# Patient Record
Sex: Female | Born: 1960 | Race: Black or African American | Hispanic: No | Marital: Married | Smoking: Never smoker
Health system: Southern US, Community
[De-identification: ages and names within clinical notes are randomized; demographics above are authoritative.]

## PROBLEM LIST (undated history)

## (undated) DIAGNOSIS — E7800 Pure hypercholesterolemia, unspecified: Secondary | ICD-10-CM

## (undated) DIAGNOSIS — D259 Leiomyoma of uterus, unspecified: Secondary | ICD-10-CM

## (undated) DIAGNOSIS — I10 Essential (primary) hypertension: Secondary | ICD-10-CM

## (undated) DIAGNOSIS — G4733 Obstructive sleep apnea (adult) (pediatric): Secondary | ICD-10-CM

---

## 2011-05-03 ENCOUNTER — Encounter (HOSPITAL_BASED_OUTPATIENT_CLINIC_OR_DEPARTMENT_OTHER): Payer: Self-pay | Admitting: Emergency Medicine

## 2011-05-03 ENCOUNTER — Emergency Department (HOSPITAL_BASED_OUTPATIENT_CLINIC_OR_DEPARTMENT_OTHER)
Admission: EM | Admit: 2011-05-03 | Discharge: 2011-05-03 | Disposition: A | Discharge status: Home / Self Care | Attending: Emergency Medicine | Admitting: Emergency Medicine

## 2011-05-03 DIAGNOSIS — R51 Headache: Secondary | ICD-10-CM | POA: Insufficient documentation

## 2011-05-03 DIAGNOSIS — I10 Essential (primary) hypertension: Secondary | ICD-10-CM | POA: Insufficient documentation

## 2011-05-03 DIAGNOSIS — E7800 Pure hypercholesterolemia, unspecified: Secondary | ICD-10-CM | POA: Insufficient documentation

## 2011-05-03 DIAGNOSIS — Z79899 Other long term (current) drug therapy: Secondary | ICD-10-CM | POA: Insufficient documentation

## 2011-05-03 HISTORY — DX: Essential (primary) hypertension: I10

## 2011-05-03 HISTORY — DX: Pure hypercholesterolemia, unspecified: E78.00

## 2011-05-03 HISTORY — DX: Leiomyoma of uterus, unspecified: D25.9

## 2011-05-03 MED ORDER — HYDROCODONE-ACETAMINOPHEN 5-500 MG PO TABS: 1.0000 | ORAL_TABLET | Freq: Four times a day (QID) | ORAL | Status: AC | PRN

## 2011-05-03 MED ORDER — SUMATRIPTAN SUCCINATE 6 MG/0.5ML ~~LOC~~ SOLN
6.0000 mg | Freq: Once | SUBCUTANEOUS | Status: AC
  Administered 2011-05-03: 6 mg via SUBCUTANEOUS
  Filled 2011-05-03: qty 0.5

## 2011-05-03 NOTE — ED Notes (Addendum)
Headache since last night.  Pain on left side of head which moves around left side of head.  Pt states she has had migraines in the past but they present differently each time.  Pt usually takes Bayer Migraine and relieves that pain but this time it did not.  Some nausea.  Some sensitivity to light and sound.  No blurry vision unless at computer.  No known fever.  Some cough yesterday.  Pt states she can take a hot shower and decreases the pain.  Pt relates she noticed some trembling in lip and two weeks ago some right arm pain.

## 2011-05-03 NOTE — ED Provider Notes (Signed)
History     CSN: [id number]  Arrival date & time 05/03/11  7829   First MD Initiated Contact with Patient 05/03/11 1055      Chief Complaint  Patient presents with  . Headache    (Consider location/radiation/quality/duration/timing/severity/associated sxs/prior treatment) Patient is a 51 y.o. female presenting with headaches. The history is provided by the patient.  Headache  The current episode started 2 days ago. The problem occurs constantly. The problem has been gradually worsening. The headache is associated with bright light. The pain is located in the frontal region. The quality of the pain is described as throbbing. The pain is severe. The pain does not radiate. Pertinent negatives include no anorexia and no fever.    Past Medical History  Diagnosis Date  . Migraine   . Uterine fibroid   . Hypertension   . Hypercholesteremia   . Vitamin D deficiency     History reviewed. No pertinent past surgical history.  History reviewed. No pertinent family history.  History  Substance Use Topics  . Smoking status: Not on file  . Smokeless tobacco: Not on file  . Alcohol Use:     OB History    Grav Para Term Preterm Abortions TAB SAB Ect Mult Living                  Review of Systems  Constitutional: Negative for fever.  Gastrointestinal: Negative for anorexia.  Neurological: Positive for headaches.  All other systems reviewed and are negative.    Allergies  Review of patient's allergies indicates no known allergies.  Home Medications   Current Outpatient Rx  Name Route Sig Dispense Refill  . ASPIRIN-ACETAMINOPHEN-CAFFEINE 250-250-65 MG PO TABS Oral Take 1 tablet by mouth every 6 (six) hours as needed.    Marland Kitchen EZETIMIBE-SIMVASTATIN 10-20 MG PO TABS Oral Take 1 tablet by mouth at bedtime.    Marland Kitchen MEDROXYPROGESTERONE ACETATE 150 MG/ML IM SUSP Intramuscular Inject 150 mg into the muscle every 3 (three) months.    . MULTIVITAMINS PO CAPS Oral Take 1 capsule by mouth  daily.    Marland Kitchen VALSARTAN 80 MG PO TABS Oral Take 80 mg by mouth daily.    Marland Kitchen VITAMIN D (ERGOCALCIFEROL) 50000 UNITS PO CAPS Oral Take 50,000 Units by mouth.      BP 131/90  Pulse 82  Temp(Src) 98.3 F (36.8 C) (Oral)  Resp 14  Ht 5\' 7"  (1.702 m)  Wt 155 lb (70.308 kg)  BMI 24.28 kg/m2  SpO2 100%  LMP 03/17/2011  Physical Exam  Nursing note and vitals reviewed. Constitutional: She is oriented to person, place, and time. She appears well-developed and well-nourished. No distress.  HENT:  Head: Normocephalic and atraumatic.  Eyes:       Fundoscopic exam wnl.  There is no papilledema.    Neck: Normal range of motion. Neck supple.  Cardiovascular: Normal rate and regular rhythm.  Exam reveals no gallop and no friction rub.   No murmur heard. Pulmonary/Chest: Effort normal and breath sounds normal. No respiratory distress. She has no wheezes.  Abdominal: Soft. Bowel sounds are normal. She exhibits no distension. There is no tenderness.  Musculoskeletal: Normal range of motion.  Neurological: She is alert and oriented to person, place, and time.  Skin: Skin is warm and dry. She is not diaphoretic.    ED Course  Procedures (including critical care time)  Labs Reviewed - No data to display No results found.   No diagnosis found.    MDM  Improved with imitrex.  Will discharge with pain meds, to return prn.        Geoffery Lyons, MD 05/03/11 1356

## 2011-05-03 NOTE — ED Notes (Signed)
Pt discharged home with one prescription. 

## 2015-04-11 ENCOUNTER — Encounter (HOSPITAL_BASED_OUTPATIENT_CLINIC_OR_DEPARTMENT_OTHER): Payer: Self-pay | Admitting: *Deleted

## 2015-04-11 ENCOUNTER — Emergency Department (HOSPITAL_BASED_OUTPATIENT_CLINIC_OR_DEPARTMENT_OTHER)

## 2015-04-11 ENCOUNTER — Emergency Department (HOSPITAL_BASED_OUTPATIENT_CLINIC_OR_DEPARTMENT_OTHER)
Admission: EM | Admit: 2015-04-11 | Discharge: 2015-04-11 | Disposition: A | Discharge status: Home / Self Care | Attending: Emergency Medicine | Admitting: Emergency Medicine

## 2015-04-11 DIAGNOSIS — I10 Essential (primary) hypertension: Secondary | ICD-10-CM | POA: Insufficient documentation

## 2015-04-11 DIAGNOSIS — R202 Paresthesia of skin: Secondary | ICD-10-CM | POA: Diagnosis not present

## 2015-04-11 DIAGNOSIS — E559 Vitamin D deficiency, unspecified: Secondary | ICD-10-CM | POA: Insufficient documentation

## 2015-04-11 DIAGNOSIS — R42 Dizziness and giddiness: Secondary | ICD-10-CM | POA: Diagnosis not present

## 2015-04-11 DIAGNOSIS — F419 Anxiety disorder, unspecified: Secondary | ICD-10-CM | POA: Diagnosis not present

## 2015-04-11 DIAGNOSIS — G43909 Migraine, unspecified, not intractable, without status migrainosus: Secondary | ICD-10-CM | POA: Diagnosis not present

## 2015-04-11 DIAGNOSIS — E7800 Pure hypercholesterolemia, unspecified: Secondary | ICD-10-CM | POA: Insufficient documentation

## 2015-04-11 DIAGNOSIS — Z86018 Personal history of other benign neoplasm: Secondary | ICD-10-CM | POA: Diagnosis not present

## 2015-04-11 LAB — BASIC METABOLIC PANEL
Anion gap: 6 (ref 5–15)
BUN: 10 mg/dL (ref 6–20)
CALCIUM: 8.6 mg/dL — AB (ref 8.9–10.3)
CO2: 23 mmol/L (ref 22–32)
CREATININE: 0.83 mg/dL (ref 0.44–1.00)
Chloride: 112 mmol/L — ABNORMAL HIGH (ref 101–111)
GFR calc non Af Amer: >60 mL/min (ref >60)
Glucose, Bld: 130 mg/dL — ABNORMAL HIGH (ref 65–99)
Potassium: 3.5 mmol/L (ref 3.5–5.1)
Sodium: 141 mmol/L (ref 135–145)

## 2015-04-11 LAB — CBC WITH DIFFERENTIAL/PLATELET
BASOS PCT: 1 %
Basophils Absolute: 0 10*3/uL (ref 0.0–0.1)
EOS ABS: 0.1 10*3/uL (ref 0.0–0.7)
Eosinophils Relative: 2 %
HCT: 36.4 % (ref 36.0–46.0)
HEMOGLOBIN: 12.5 g/dL (ref 12.0–15.0)
Lymphocytes Relative: 32 %
Lymphs Abs: 1.2 10*3/uL (ref 0.7–4.0)
MCH: 27.5 pg (ref 26.0–34.0)
MCHC: 34.3 g/dL (ref 30.0–36.0)
MCV: 80.2 fL (ref 78.0–100.0)
MONO ABS: 0.4 10*3/uL (ref 0.1–1.0)
MONOS PCT: 10 %
NEUTROS PCT: 55 %
Neutro Abs: 2 10*3/uL (ref 1.7–7.7)
Platelets: 309 10*3/uL (ref 150–400)
RBC: 4.54 MIL/uL (ref 3.87–5.11)
RDW: 13.5 % (ref 11.5–15.5)
WBC: 3.6 10*3/uL — ABNORMAL LOW (ref 4.0–10.5)

## 2015-04-11 NOTE — ED Notes (Signed)
Dizziness. She was awaken with an electrical sensation going through her brain followed by anxiety. Her muscles have been contracting in her back and neck. Tingling in both hands and feel throughout the day on and off.

## 2015-04-11 NOTE — ED Provider Notes (Signed)
CSN: [id number]     Arrival date & time 04/11/15  1829 History  By signing my name below, I, Christina Calderon, attest that this documentation has been prepared under the direction and in the presence of Quintella Reichert, MD. Electronically Signed: Meriel Calderon, ED Scribe. 04/11/2015. 7:14 PM.  Chief Complaint  Patient presents with  . Dizziness  . Tingling   The history is provided by the patient. No language interpreter was used.   HPI Comments: Christina Calderon is a 54 y.o. female, with a h/o migraines, HTN, and HLD, who presents to the Emergency Department complaining of a sudden onset, severe tingling sensation and headache onset early this morning, that woke her up from sleep, and that has since resolved. Pt states she felt 'an electric sensation' ranging from her right, lateral neck that radiated 'through my brain' and that woke her up from sleep early this morning. She then notes onset of a tingling sensation radiating throughout her entire body, that started in her feet and radiated up her legs and to her upper body. Additionally, she describes the feeling that 'her mind left her body'. Pt reports she performed breathing exercises to calm herself down following the incident with resolution of her symptoms. She has a h/o of a similar episode when she received news that her father had an aneurysm 3 months ago. Pt states she was told she was 'stressed out' by a doctor in [place]. She was then evaluated by a doctor upon return to the [place] who told her she had experienced a dissociation event and he recommended breathing exercises and yoga. Now, she denies a headache but reports mild tingling in her feet, 'a heaviness in my tongue', and tachycardia. Pt also has a h/o palpitations. Pt is tearful during history and reports being significantly stressed over her abroad family's health conditions (father with h/o aneurysm and mother with Alzheimers).   Past Medical History  Diagnosis Date  . Migraine    . Uterine fibroid   . Hypertension   . Hypercholesteremia   . Vitamin D deficiency    History reviewed. No pertinent past surgical history. No family history on file. Social History  Substance Use Topics  . Smoking status: Never Smoker   . Smokeless tobacco: None  . Alcohol Use: No   OB History    No data available     Review of Systems  Constitutional: Negative for fever.  Neurological: Positive for headaches.  Psychiatric/Behavioral: The patient is nervous/anxious.   All other systems reviewed and are negative.  Allergies  Review of patient's allergies indicates no known allergies.  Home Medications   Prior to Admission medications   Medication Sig Start Date End Date Taking? Authorizing Provider  aspirin-acetaminophen-caffeine (EXCEDRIN MIGRAINE) [phone] MG per tablet Take 1 tablet by mouth every 6 (six) hours as needed.    Historical Provider, MD  ezetimibe-simvastatin (VYTORIN) 10-20 MG per tablet Take 1 tablet by mouth at bedtime.    Historical Provider, MD  medroxyPROGESTERone (DEPO-PROVERA) 150 MG/ML injection Inject 150 mg into the muscle every 3 (three) months.    Historical Provider, MD  Multiple Vitamin (MULTIVITAMIN) capsule Take 1 capsule by mouth daily.    Historical Provider, MD  valsartan (DIOVAN) 80 MG tablet Take 80 mg by mouth daily.    Historical Provider, MD  Vitamin D, Ergocalciferol, (DRISDOL) 50000 UNITS CAPS Take 50,000 Units by mouth.    Historical Provider, MD   BP 151/97 mmHg  Pulse 97  Temp(Src) 99 F (37.2 C) (  Oral)  Resp 18  Ht 5\' 7"  (1.702 m)  Wt 173 lb (78.472 kg)  BMI 27.09 kg/m2  SpO2 100% Physical Exam  Constitutional: She is oriented to person, place, and time. She appears well-developed and well-nourished.  Anxious, tearful.   HENT:  Head: Normocephalic and atraumatic.  Eyes: EOM are normal. Pupils are equal, round, and reactive to light.  Cardiovascular: Normal rate and regular rhythm.   No murmur  heard. Pulmonary/Chest: Effort normal and breath sounds normal. No respiratory distress.  Abdominal: Soft. There is no tenderness. There is no rebound and no guarding.  Musculoskeletal: She exhibits no edema or tenderness.  Neurological: She is alert and oriented to person, place, and time. No cranial nerve deficit. Coordination normal.  Cranial nerves 2-12 intact.   Skin: Skin is warm and dry.  Psychiatric: She has a normal mood and affect. Her behavior is normal.  Nursing note and vitals reviewed.   ED Course  Procedures  DIAGNOSTIC STUDIES: Oxygen Saturation is 100% on RA, normal by my interpretation.    COORDINATION OF CARE: 7:12 PM Discussed treatment plan which includes to order a head CT and diagnostic labs with pt. Discussed unremarkable EKG reading with pt. Pt acknowledges and agrees to plan.   Labs Review Labs Reviewed  BASIC METABOLIC PANEL - Abnormal; Notable for the following:    Chloride 112 (*)    Glucose, Bld 130 (*)    Calcium 8.6 (*)    All other components within normal limits  CBC WITH DIFFERENTIAL/PLATELET - Abnormal; Notable for the following:    WBC 3.6 (*)    All other components within normal limits    Imaging Review Ct Head Wo Contrast  04/11/2015  CLINICAL DATA:  54 year old female with dizziness EXAM: CT HEAD WITHOUT CONTRAST TECHNIQUE: Contiguous axial images were obtained from the base of the skull through the vertex without intravenous contrast. COMPARISON:  None. FINDINGS: The ventricles and sulci are appropriate in size for patient's age. Mild periventricular and deep white matter hypodensities represent chronic microvascular ischemic changes. There is no intracranial hemorrhage. No mass effect or midline shift identified. The visualized paranasal sinuses and mastoid air cells are well aerated. The calvarium is intact. IMPRESSION: No acute intracranial hemorrhage. Mild chronic microvascular ischemic disease. If symptoms persist and there are no  contraindications, MRI may provide better evaluation if clinically indicated Electronically Signed   By: Anner Crete M.D.   On: 04/11/2015 20:43   I have personally reviewed and evaluated these images and lab results as part of my medical decision-making.   EKG Interpretation   Date/Time:  Thursday April 11 2015 18:42:10 EST Ventricular Rate:  85 PR Interval:  142 QRS Duration: 78 QT Interval:  368 QTC Calculation: 437 R Axis:   57 Text Interpretation:  Normal sinus rhythm Normal ECG Confirmed by Hazle Coca [phone]) on 04/11/2015 6:57:08 PM      MDM   Final diagnoses:  Paresthesia   Patient here for evaluation of generalized body paresthesias and electric sensation through her head. Her symptoms are essentially resolved in the emergency department. She has a nonfocal neurologic examination. She's had multiple similar episodes in the past. Presentation is not consistent with CVA, subarachnoid hemorrhage, meningitis, Guillain-Barr. Discussed with patient findings of studies as well as CT head with chronic microvascular disease. Recommend outpatient PCP as well as neurology follow-up for further evaluation and possible MRI of her brain.  I personally performed the services described in this documentation, which was scribed in  my presence. The recorded information has been reviewed and is accurate.    Quintella Reichert, MD 04/12/15 [phone]

## 2015-04-11 NOTE — Discharge Instructions (Signed)
Paresthesia Paresthesia is an abnormal burning or prickling sensation. This sensation is generally felt in the hands, arms, legs, or feet. However, it may occur in any part of the body. Usually, it is not painful. The feeling may be described as:  Tingling or numbness.  Pins and needles.  Skin crawling.  Buzzing.  Limbs falling asleep.  Itching. Most people experience temporary (transient) paresthesia at some time in their lives. Paresthesia may occur when you breathe too quickly (hyperventilation). It can also occur without any apparent cause. Commonly, paresthesia occurs when pressure is placed on a nerve. The sensation quickly goes away after the pressure is removed. For some people, however, paresthesia is a long-lasting (chronic) condition that is caused by an underlying disorder. If you continue to have paresthesia, you may need further medical evaluation. HOME CARE INSTRUCTIONS Watch your condition for any changes. Taking the following actions may help to lessen any discomfort that you are feeling:  Avoid drinking alcohol.  Try acupuncture or massage to help relieve your symptoms.  Keep all follow-up visits as directed by your health care provider. This is important. SEEK MEDICAL CARE IF:  You continue to have episodes of paresthesia.  Your burning or prickling feeling gets worse when you walk.  You have pain, cramps, or dizziness.  You develop a rash. SEEK IMMEDIATE MEDICAL CARE IF:  You feel weak.  You have trouble walking or moving.  You have problems with speech, understanding, or vision.  You feel confused.  You cannot control your bladder or bowel movements.  You have numbness after an injury.  You faint.   This information is not intended to replace advice given to you by your health care provider. Make sure you discuss any questions you have with your health care provider.   Document Released: 03/20/2002 Document Revised: 08/14/2014 Document Reviewed:  03/26/2014 Elsevier Interactive Patient Education 2016 [employer] Inc[employer]  

## 2016-06-28 ENCOUNTER — Encounter (HOSPITAL_BASED_OUTPATIENT_CLINIC_OR_DEPARTMENT_OTHER): Payer: Self-pay | Admitting: *Deleted

## 2016-06-28 ENCOUNTER — Emergency Department (HOSPITAL_BASED_OUTPATIENT_CLINIC_OR_DEPARTMENT_OTHER)
Admission: EM | Admit: 2016-06-28 | Discharge: 2016-06-29 | Disposition: A | Discharge status: Home / Self Care | Attending: Emergency Medicine | Admitting: Emergency Medicine

## 2016-06-28 DIAGNOSIS — M791 Myalgia: Secondary | ICD-10-CM | POA: Diagnosis not present

## 2016-06-28 DIAGNOSIS — R110 Nausea: Secondary | ICD-10-CM | POA: Diagnosis not present

## 2016-06-28 DIAGNOSIS — I10 Essential (primary) hypertension: Secondary | ICD-10-CM | POA: Insufficient documentation

## 2016-06-28 DIAGNOSIS — Z79899 Other long term (current) drug therapy: Secondary | ICD-10-CM | POA: Diagnosis not present

## 2016-06-28 DIAGNOSIS — R1013 Epigastric pain: Secondary | ICD-10-CM

## 2016-06-28 LAB — CBC WITH DIFFERENTIAL/PLATELET
BASOS ABS: 0 10*3/uL (ref 0.0–0.1)
Basophils Relative: 1 %
EOS PCT: 4 %
Eosinophils Absolute: 0.2 10*3/uL (ref 0.0–0.7)
HEMATOCRIT: 35.1 % — AB (ref 36.0–46.0)
Hemoglobin: 12.1 g/dL (ref 12.0–15.0)
LYMPHS ABS: 1.6 10*3/uL (ref 0.7–4.0)
LYMPHS PCT: 44 %
MCH: 27.8 pg (ref 26.0–34.0)
MCHC: 34.5 g/dL (ref 30.0–36.0)
MCV: 80.5 fL (ref 78.0–100.0)
MONO ABS: 0.5 10*3/uL (ref 0.1–1.0)
MONOS PCT: 13 %
NEUTROS ABS: 1.3 10*3/uL — AB (ref 1.7–7.7)
Neutrophils Relative %: 38 %
Platelets: 262 10*3/uL (ref 150–400)
RBC: 4.36 MIL/uL (ref 3.87–5.11)
RDW: 13.3 % (ref 11.5–15.5)
WBC: 3.5 10*3/uL — ABNORMAL LOW (ref 4.0–10.5)

## 2016-06-28 NOTE — ED Provider Notes (Signed)
[facility] Provider Note   CSN: [id number] Arrival date & time: 06/28/16  2326   By signing my name below, I, Delton Prairie, attest that this documentation has been prepared under the direction and in the presence of Merryl Hacker, MD  Electronically Signed: Delton Prairie, ED Scribe. 06/28/16. 12:28 AM.   History   Chief Complaint Chief Complaint  Patient presents with  . Chest Pain    The history is provided by the patient. No language interpreter was used.   HPI Comments:  Christina Calderon is a 56 y.o. female, with a PMHx of HTN, who presents to the Emergency Department complaining of acute onset, gradually worsening, burning, sharp, "6-7/10" chest pain/Abdominal pain onset 45 minutes PTA. Pt states her pain radiates to her back. She also reports belching and nausea. She states she usually does not drink but did have a cocktail around 3 PM today. She also notes she ate shrimp with an adequate amount of hot sauce for dinner. No alleviating or modifying factors noted. Pt denies a cough, congestion, a hx of heart disease or any other associated symptoms. Pt states she takes medication for a heart flutter. No other complaints noted.   Past Medical History:  Diagnosis Date  . Hypercholesteremia   . Hypertension   . Migraine   . Uterine fibroid   . Vitamin D deficiency     There are no active problems to display for this patient.   History reviewed. No pertinent surgical history.  OB History    No data available       Home Medications    Prior to Admission medications   Medication Sig Start Date End Date Taking? Authorizing Provider  ezetimibe-simvastatin (VYTORIN) 10-40 MG tablet Take 1 tablet by mouth daily.   Yes Historical Provider, MD  propranolol ER (INDERAL LA) 80 MG 24 hr capsule Take 80 mg by mouth daily.   Yes Historical Provider, MD  valsartan-hydrochlorothiazide (DIOVAN HCT) 80-12.5 MG tablet Take 1 tablet by mouth daily.   Yes Historical  Provider, MD  aspirin-acetaminophen-caffeine (EXCEDRIN MIGRAINE) [phone] MG per tablet Take 1 tablet by mouth every 6 (six) hours as needed.    Historical Provider, MD  ezetimibe-simvastatin (VYTORIN) 10-20 MG per tablet Take 1 tablet by mouth at bedtime.    Historical Provider, MD  medroxyPROGESTERone (DEPO-PROVERA) 150 MG/ML injection Inject 150 mg into the muscle every 3 (three) months.    Historical Provider, MD  Multiple Vitamin (MULTIVITAMIN) capsule Take 1 capsule by mouth daily.    Historical Provider, MD  omeprazole (PRILOSEC) 20 MG capsule Take 1 capsule (20 mg total) by mouth daily. 06/29/16   Merryl Hacker, MD  valsartan (DIOVAN) 80 MG tablet Take 80 mg by mouth daily.    Historical Provider, MD  Vitamin D, Ergocalciferol, (DRISDOL) 50000 UNITS CAPS Take 50,000 Units by mouth.    Historical Provider, MD    Family History History reviewed. No pertinent family history.  Social History Social History  Substance Use Topics  . Smoking status: Never Smoker  . Smokeless tobacco: Never Used  . Alcohol use No     Allergies   Patient has no known allergies.   Review of Systems Review of Systems  HENT: Negative for congestion.   Respiratory: Negative for cough.   Cardiovascular: Positive for chest pain.  Gastrointestinal: Positive for abdominal pain and nausea. Negative for vomiting.  Musculoskeletal: Positive for myalgias.  All other systems reviewed and are negative.  Physical Exam Updated Vital Signs  BP 116/89   Pulse 66   Temp 97.7 F (36.5 C) (Oral)   Resp 13   Ht 5\' 7"  (1.702 m)   Wt 160 lb (72.6 kg)   SpO2 98%   BMI 25.06 kg/m   Physical Exam  Constitutional: She is oriented to person, place, and time. She appears well-developed and well-nourished.  HENT:  Head: Normocephalic and atraumatic.  Cardiovascular: Normal rate, regular rhythm and normal heart sounds.   Pulmonary/Chest: Effort normal. No respiratory distress. She has no wheezes.  Abdominal:  Soft. Bowel sounds are normal. She exhibits no mass. There is tenderness. There is no rebound.  Minimal epigastric tenderness to palpation without rebound or guarding, no right upper quadrant tenderness  Neurological: She is alert and oriented to person, place, and time.  Skin: Skin is warm and dry.  Psychiatric: She has a normal mood and affect.  Nursing note and vitals reviewed.    ED Treatments / Results  DIAGNOSTIC STUDIES:  Oxygen Saturation is 100% on RA, normal by my interpretation.    COORDINATION OF CARE:  12:22 AM Discussed treatment plan with pt at bedside and pt agreed to plan.  Labs (all labs ordered are listed, but only abnormal results are displayed) Labs Reviewed  CBC WITH DIFFERENTIAL/PLATELET - Abnormal; Notable for the following:       Result Value   WBC 3.5 (*)    HCT 35.1 (*)    Neutro Abs 1.3 (*)    All other components within normal limits  COMPREHENSIVE METABOLIC PANEL - Abnormal; Notable for the following:    Glucose, Bld 112 (*)    All other components within normal limits  LIPASE, BLOOD  TROPONIN I    EKG  EKG Interpretation  Date/Time:  Sunday June 28 2016 23:31:46 EDT Ventricular Rate:  78 PR Interval:  170 QRS Duration: 74 QT Interval:  354 QTC Calculation: 403 R Axis:   21 Text Interpretation:  Normal sinus rhythm Normal ECG Confirmed by Jaimya Feliciano  MD, Siomara Burkel ([id number]) on 06/28/2016 11:45:39 PM       Radiology Dg Abdomen Acute W/chest  Result Date: 06/29/2016 CLINICAL DATA:  Upper abdominal pain EXAM: DG ABDOMEN ACUTE W/ 1V CHEST COMPARISON:  None. FINDINGS: Cardiomediastinal contours are normal. The lungs are clear. There is bilateral costophrenic angle blunting, likely scarring. No pneumothorax. There are multiple calcified fibroids in the pelvis. There is no free intraperitoneal air. No dilated small bowel or enteric fluid levels. Large amount of stool in the left upper quadrant. IMPRESSION: 1. No active cardiopulmonary disease. 2. No  free intraperitoneal air. 3. No evidence of small-bowel obstruction. 4. Multiple calcified uterine fibroids. Electronically Signed   By: Ulyses Jarred M.D.   On: 06/29/2016 00:59    Procedures Procedures (including critical care time)  Medications Ordered in ED Medications  gi cocktail (Maalox,Lidocaine,Donnatal) (30 mLs Oral Given 06/29/16 0054)  pantoprazole (PROTONIX) injection 40 mg (40 mg Intravenous Given 06/29/16 0054)  morphine 4 MG/ML injection 4 mg (4 mg Intravenous Given 06/29/16 0054)     Initial Impression / Assessment and Plan / ED Course  I have reviewed the triage vital signs and the nursing notes.  Pertinent labs & imaging results that were available during my care of the patient were reviewed by me and considered in my medical decision making (see chart for details).     Patient presents for chest pain and epigastric pain. She relates this to food. She is overall nontoxic appearing. Vital signs reassuring. EKG is nonischemic.  History is most suggestive of reflux versus gallbladder pathology. Troponin negative. Basic labwork including LFT and lipase reassuring. Acute abdominal series without any evidence of obstruction. Patient much improved after morphine, Protonix, and a GI cocktail. She is able tolerate fluids. Will start on a daily acid reducer. If symptoms persist with food intake, she may need right upper quadrant ultrasound and/or endoscopy. Discussed this with the patient. She is in agreement with the plan. GI follow-up provided.  After history, exam, and medical workup I feel the patient has been appropriately medically screened and is safe for discharge home. Pertinent diagnoses were discussed with the patient. Patient was given return precautions.   Final Clinical Impressions(s) / ED Diagnoses   Final diagnoses:  Epigastric pain    New Prescriptions New Prescriptions   OMEPRAZOLE (PRILOSEC) 20 MG CAPSULE    Take 1 capsule (20 mg total) by mouth daily.   I  personally performed the services described in this documentation, which was scribed in my presence. The recorded information has been reviewed and is accurate.     Merryl Hacker, MD 06/29/16 [phone]

## 2016-06-28 NOTE — ED Triage Notes (Signed)
Pt states she ate spicy food tonight and had gone to bed. Had sudden onset of upper abd pain radiating to back. Started belching a lot. Describes as burning, stabbing, "like nothing I've ever had before." Denies other s/s.

## 2016-06-29 ENCOUNTER — Emergency Department (HOSPITAL_BASED_OUTPATIENT_CLINIC_OR_DEPARTMENT_OTHER)

## 2016-06-29 LAB — COMPREHENSIVE METABOLIC PANEL
ALBUMIN: 4 g/dL (ref 3.5–5.0)
ALK PHOS: 75 U/L (ref 38–126)
ALT: 31 U/L (ref 14–54)
ANION GAP: 7 (ref 5–15)
AST: 39 U/L (ref 15–41)
BILIRUBIN TOTAL: 0.9 mg/dL (ref 0.3–1.2)
BUN: 10 mg/dL (ref 6–20)
CALCIUM: 9.2 mg/dL (ref 8.9–10.3)
CO2: 25 mmol/L (ref 22–32)
Chloride: 107 mmol/L (ref 101–111)
Creatinine, Ser: 0.62 mg/dL (ref 0.44–1.00)
GFR calc non Af Amer: >60 mL/min (ref >60)
Glucose, Bld: 112 mg/dL — ABNORMAL HIGH (ref 65–99)
Potassium: 3.5 mmol/L (ref 3.5–5.1)
Sodium: 139 mmol/L (ref 135–145)
TOTAL PROTEIN: 7 g/dL (ref 6.5–8.1)

## 2016-06-29 LAB — TROPONIN I

## 2016-06-29 LAB — LIPASE, BLOOD: LIPASE: 32 U/L (ref 11–51)

## 2016-06-29 MED ORDER — MORPHINE SULFATE (PF) 4 MG/ML IV SOLN
4.0000 mg | Freq: Once | INTRAVENOUS | Status: AC
  Administered 2016-06-29: 4 mg via INTRAVENOUS
  Filled 2016-06-29: qty 1

## 2016-06-29 MED ORDER — GI COCKTAIL ~~LOC~~
30.0000 mL | Freq: Once | ORAL | Status: AC
  Administered 2016-06-29: 30 mL via ORAL
  Filled 2016-06-29: qty 30

## 2016-06-29 MED ORDER — PANTOPRAZOLE SODIUM 40 MG IV SOLR
40.0000 mg | Freq: Once | INTRAVENOUS | Status: AC
  Administered 2016-06-29: 40 mg via INTRAVENOUS
  Filled 2016-06-29: qty 40

## 2016-06-29 MED ORDER — OMEPRAZOLE 20 MG PO CPDR: 20.0000 mg | DELAYED_RELEASE_CAPSULE | Freq: Every day | ORAL | 0 refills | Status: AC

## 2016-06-29 NOTE — ED Notes (Signed)
EDP at [facility], pt given ice water for PO challenge.

## 2016-06-29 NOTE — Discharge Instructions (Signed)
You were seen today for upper abdominal pain. Your workup is reassuring. This is likely reflux. It could also be gallbladder disease. You'll be started on an acid reducer. If he continued to experience symptoms with eating, you may need to have a gallbladder ultrasound.  Follow-up with your primary physician. If symptoms worsen, GI evaluation may also be helpful. Referral given above. If you have any new or worsening symptoms including vomiting, worsening pain, you should be reevaluated immediately.

## 2016-06-29 NOTE — ED Notes (Signed)
Alert, NAD, calm, interactive, resps e/u, speaking in clear complete sentences, no dyspnea noted, skin W&D, VSS, c/o epigastric and RUQ pain, onset after eating spicy shrimp, also mentions mixed drink/ ETOH, (denies: sob, NVD, fever, palpitations, numbness/ tingling, dizziness or visual changes). Family at [facility].

## 2017-05-09 IMAGING — CT CT HEAD W/O CM
1 series · 16 of 30 positions shown, 20 images · non-contrast
Comparison: None.

CLINICAL DATA: 54-year-old female with dizziness

EXAM:
CT HEAD WITHOUT CONTRAST
TECHNIQUE: Contiguous axial images were obtained from the base of the skull
through the vertex without intravenous contrast.

[Series 2: head wo · axial · 0.45mm/px · z∈[-102,+43]mm · 16 of 33 slices shown, 20 images]
[im 2/33  brain]
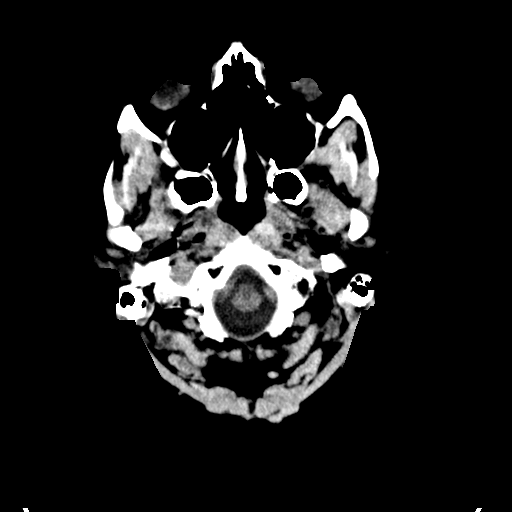
[im 2/33  bone]
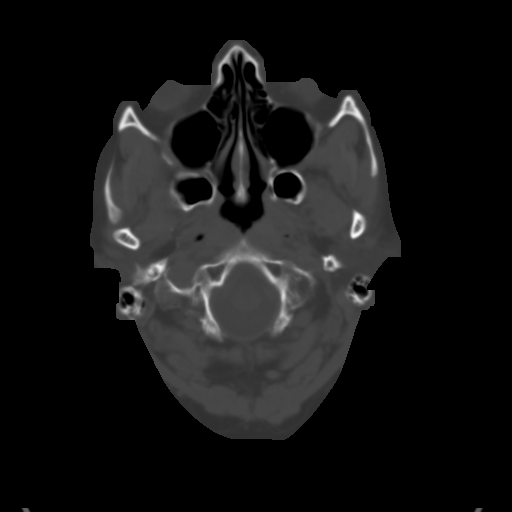
[im 4/33  brain]
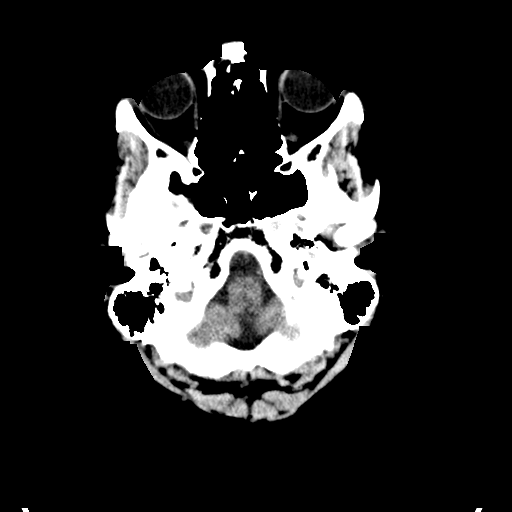
[im 6/33  brain]
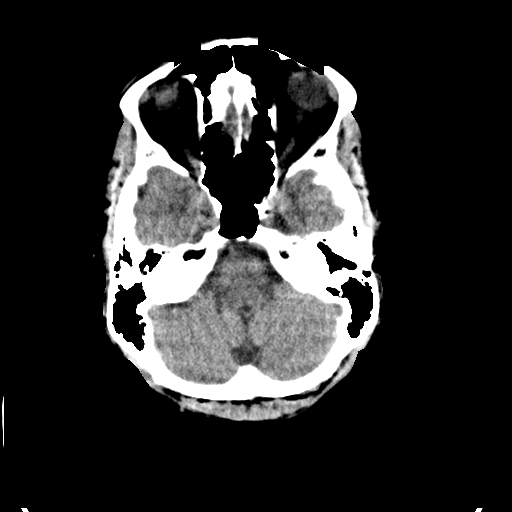
[im 8/33  brain]
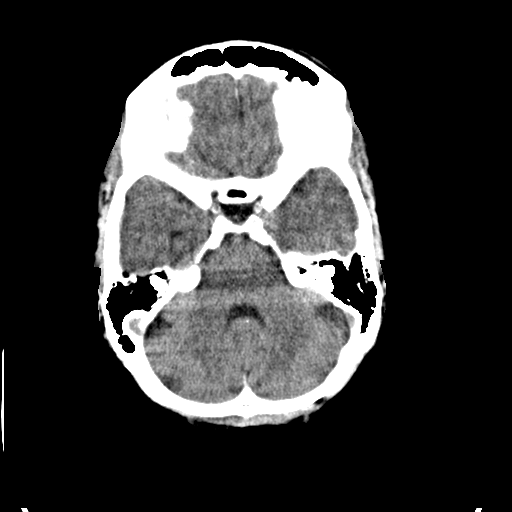
[im 9/33  brain]
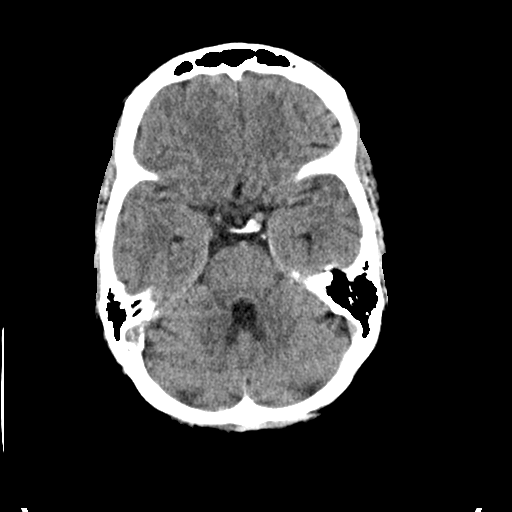
[im 9/33  bone]
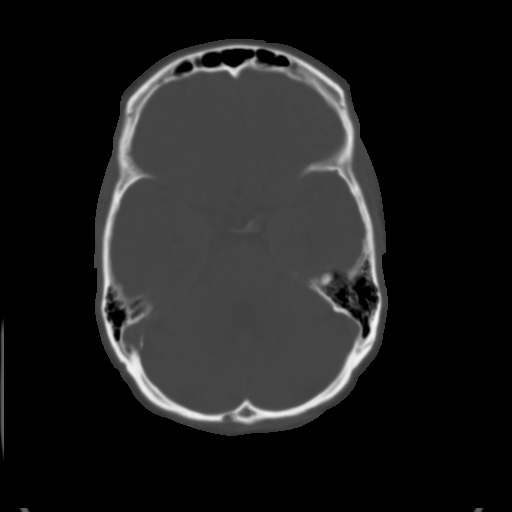
[im 12/33  brain]
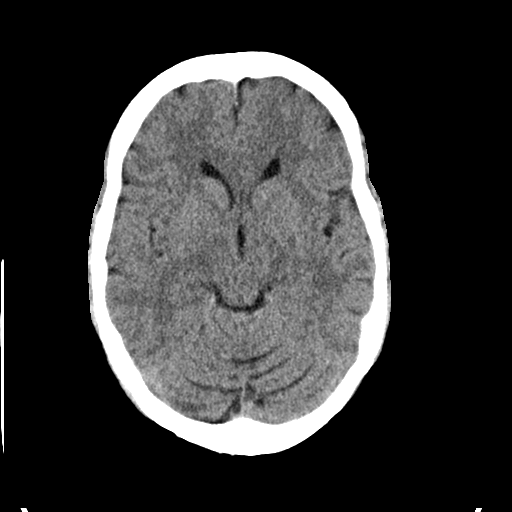
[im 14/33  brain]
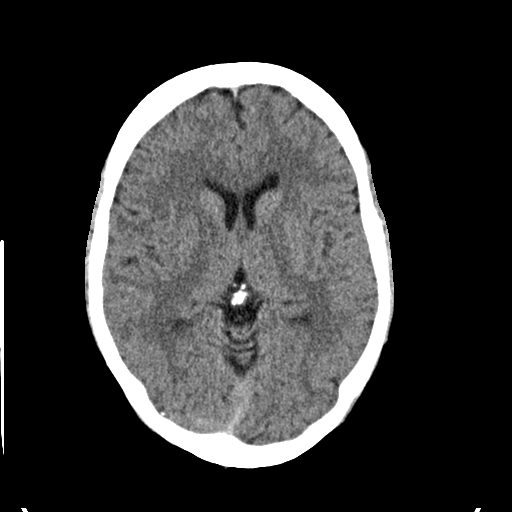
[im 16/33  brain]
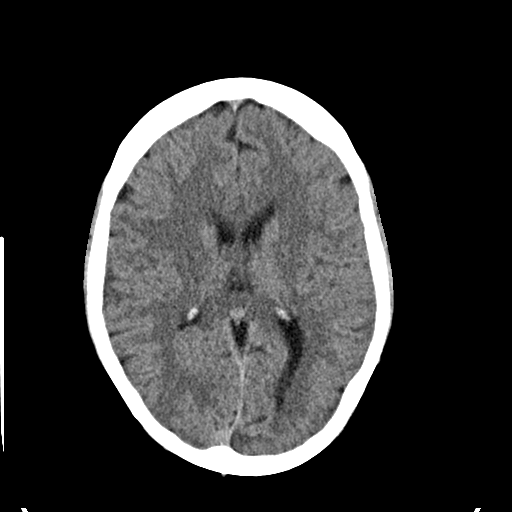
[im 17/33  brain]
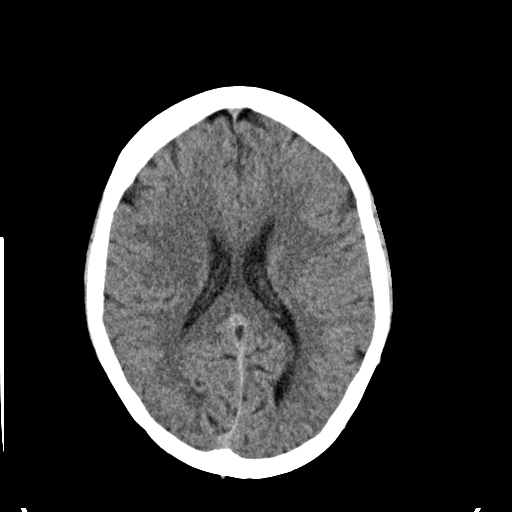
[im 17/33  bone]
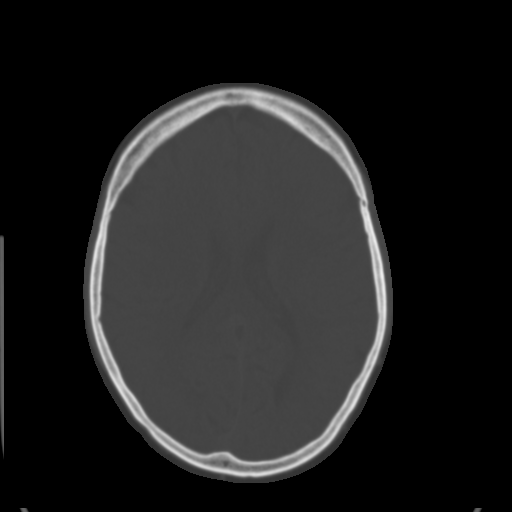
[im 19/33  brain]
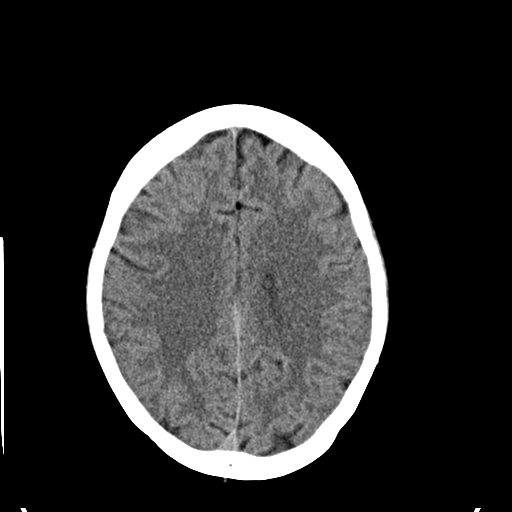
[im 21/33  brain]
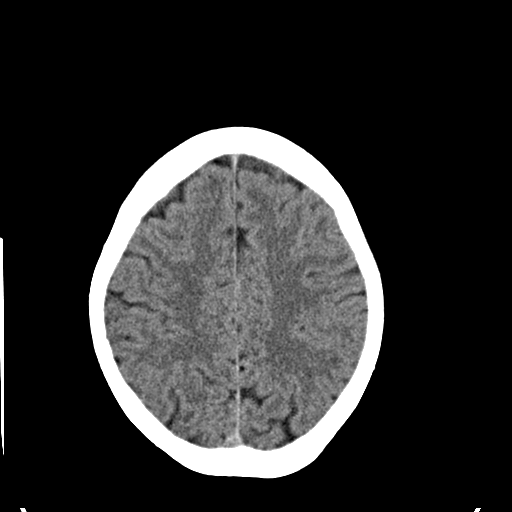
[im 24/33  brain]
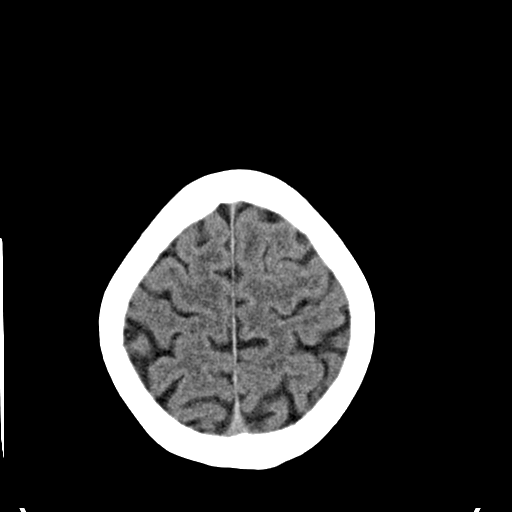
[im 25/33  brain]
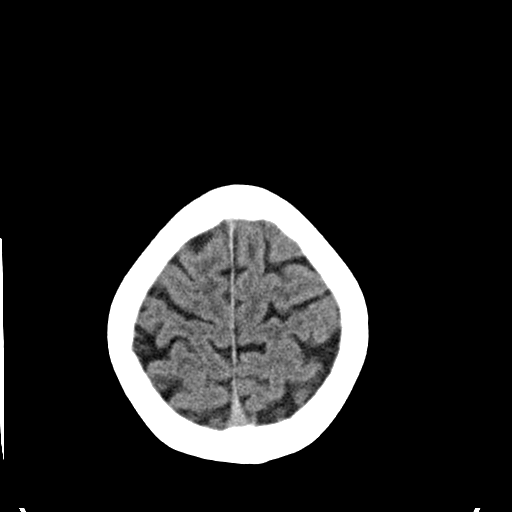
[im 25/33  bone]
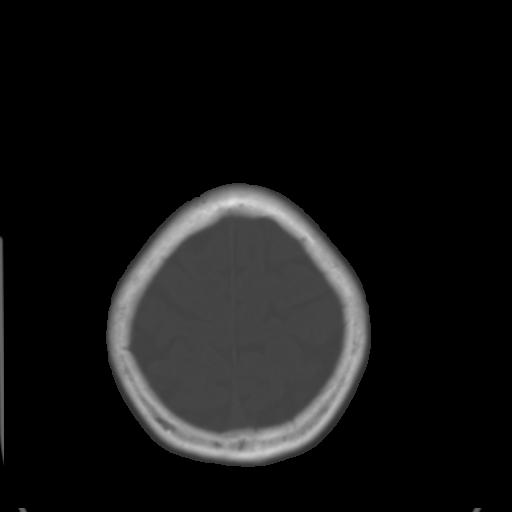
[im 27/33  brain]
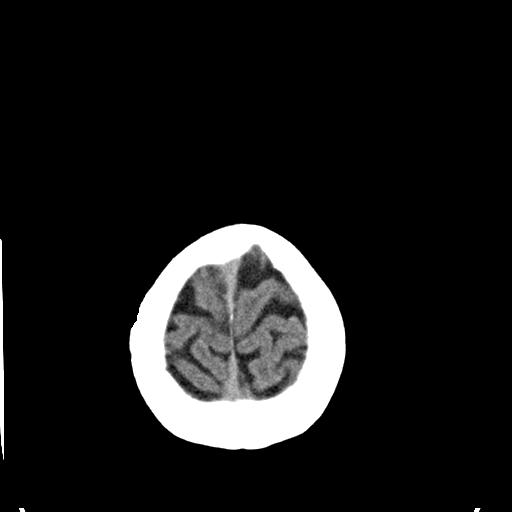
[im 29/33  brain]
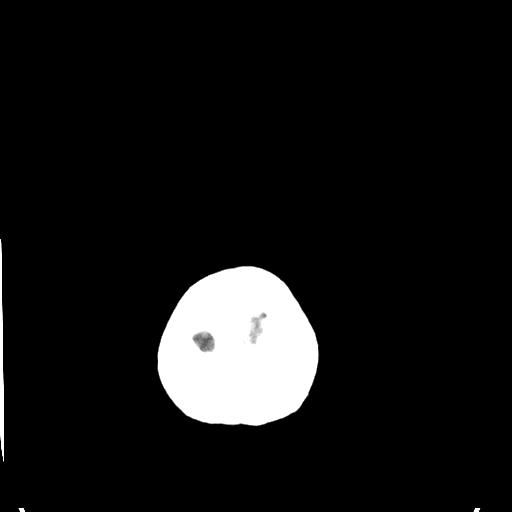
[im 31/33  brain]
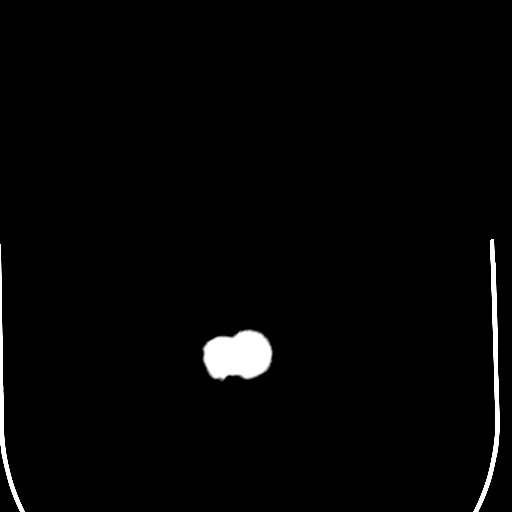

[16 of 30 positions shown; findings below may reference images not displayed]

FINDINGS: The ventricles and sulci are appropriate in size for patient's age.
Mild periventricular and deep white matter hypodensities represent
chronic microvascular ischemic changes. There is no intracranial
hemorrhage. No mass effect or midline shift identified.

The visualized paranasal sinuses and mastoid air cells are well
aerated. The calvarium is intact.
IMPRESSION: No acute intracranial hemorrhage.

Mild chronic microvascular ischemic disease.

If symptoms persist and there are no contraindications, MRI may
provide better evaluation if clinically indicated

## 2019-05-31 ENCOUNTER — Emergency Department (HOSPITAL_BASED_OUTPATIENT_CLINIC_OR_DEPARTMENT_OTHER)
Admission: EM | Admit: 2019-05-31 | Discharge: 2019-05-31 | Disposition: A | Discharge status: Home / Self Care | Attending: Emergency Medicine | Admitting: Emergency Medicine

## 2019-05-31 ENCOUNTER — Encounter (HOSPITAL_BASED_OUTPATIENT_CLINIC_OR_DEPARTMENT_OTHER): Payer: Self-pay

## 2019-05-31 ENCOUNTER — Other Ambulatory Visit: Payer: Self-pay

## 2019-05-31 DIAGNOSIS — E782 Mixed hyperlipidemia: Secondary | ICD-10-CM | POA: Diagnosis not present

## 2019-05-31 DIAGNOSIS — R300 Dysuria: Secondary | ICD-10-CM | POA: Diagnosis not present

## 2019-05-31 DIAGNOSIS — I10 Essential (primary) hypertension: Secondary | ICD-10-CM | POA: Insufficient documentation

## 2019-05-31 DIAGNOSIS — M545 Low back pain, unspecified: Secondary | ICD-10-CM

## 2019-05-31 DIAGNOSIS — Z79899 Other long term (current) drug therapy: Secondary | ICD-10-CM | POA: Insufficient documentation

## 2019-05-31 LAB — URINALYSIS, ROUTINE W REFLEX MICROSCOPIC
Bilirubin Urine: NEGATIVE
Glucose, UA: NEGATIVE mg/dL
Ketones, ur: NEGATIVE mg/dL
Nitrite: NEGATIVE
Protein, ur: NEGATIVE mg/dL
Specific Gravity, Urine: <1.005 — ABNORMAL LOW (ref 1.005–1.030)
pH: 6.5 (ref 5.0–8.0)

## 2019-05-31 LAB — URINALYSIS, MICROSCOPIC (REFLEX)

## 2019-05-31 MED ORDER — CYCLOBENZAPRINE HCL 10 MG PO TABS: 10.0000 mg | ORAL_TABLET | Freq: Two times a day (BID) | ORAL | 0 refills | Status: AC | PRN

## 2019-05-31 MED ORDER — CYCLOBENZAPRINE HCL 5 MG PO TABS
5.0000 mg | ORAL_TABLET | Freq: Once | ORAL | Status: AC
  Administered 2019-05-31: 5 mg via ORAL
  Filled 2019-05-31: qty 1

## 2019-05-31 MED ORDER — NAPROXEN 500 MG PO TABS: 500.0000 mg | ORAL_TABLET | Freq: Two times a day (BID) | ORAL | 0 refills | Status: AC | PRN

## 2019-05-31 MED ORDER — NAPROXEN 250 MG PO TABS
500.0000 mg | ORAL_TABLET | Freq: Once | ORAL | Status: AC
  Administered 2019-05-31: 500 mg via ORAL
  Filled 2019-05-31: qty 2

## 2019-05-31 NOTE — Discharge Instructions (Addendum)
You have been seen today for back pain. Please read and follow all provided instructions. Return to the emergency room for worsening condition or new concerning symptoms.    Your urine showed possible infection.  As we discussed you will get a phone call if your urine sample shows infection and you need an antibiotic.  1. Medications:  Prescription sent to your pharmacy for Naproxen. This is an antiinflammatory, please take as prescribed. This medicine can cause upset stomach so you should take it with food.  Do not take any additional ibuprofen, Aleve or Motrin as these medications are similar. -Prescription also sent for Flexeril.  Is a muscle relaxer.  Please take as prescribed.  As we discussed most people take this at night before bed.  Do not drive or work while taking this as it can make you drowsy.  Continue usual home medications Take medications as prescribed. Please review all of the medicines and only take them if you do not have an allergy to them.   2. Treatment: rest, drink plenty of fluids  3. Follow Up:  Please follow up with primary care provider in 2 to 5 days for symptom recheck.   It is also a possibility that you have an allergic reaction to any of the medicines that you have been prescribed - Everybody reacts differently to medications and while MOST people have no trouble with most medicines, you may have a reaction such as nausea, vomiting, rash, swelling, shortness of breath. If this is the case, please stop taking the medicine immediately and contact your physician.  ?

## 2019-05-31 NOTE — ED Provider Notes (Signed)
[facility] EMERGENCY DEPARTMENT Provider Note   CSN: [id number] Arrival date & time: 05/31/19  1837     History Chief Complaint  Patient presents with  . Back Pain    Christina Calderon is a 59 y.o. female with medical history significant for hypertension, hyperlipidemia presenting to emergency room today with chief complaint of intermittent low back pain x4 days.  Describes pain as an aching sensation. Denies pain at rest, only has pain with movement. Patient states the pain started the day after a Zumba workout.  She is unsure if she moved a certain way to cause the pain or pulled a muscle.  She was seen at her PCP office yesterday and there was thought to be MSK etiology of the pain.  Patient states she has been taking Tylenol with minimal symptom relief. She is also endorsing dysuria last week. She reports sharp pain with urination that resolved without intervention, symptom lasted 2 days. She denies any gross hematuria or urinary frequency.  Denies fevers, weight loss, numbness/weakness of upper and lower extremities, bowel/bladder incontinence, urinary retention, history of cancer, saddle anesthesia, history of back surgery, history of IVDA.  History provided by patient with additional history obtained from chart review.       Past Medical History:  Diagnosis Date  . Hypercholesteremia   . Hypertension   . Migraine   . Uterine fibroid   . Vitamin D deficiency     There are no problems to display for this patient.   History reviewed. No pertinent surgical history.   OB History   No obstetric history on file.     No family history on file.  Social History   Tobacco Use  . Smoking status: Never Smoker  . Smokeless tobacco: Never Used  Substance Use Topics  . Alcohol use: No  . Drug use: No    Home Medications Prior to Admission medications   Medication Sig Start Date End Date Taking? Authorizing Provider  aspirin-acetaminophen-caffeine (EXCEDRIN  MIGRAINE) [phone] MG per tablet Take 1 tablet by mouth every 6 (six) hours as needed.    [provider]  cyclobenzaprine (FLEXERIL) 10 MG tablet Take 1 tablet (10 mg total) by mouth 2 (two) times daily as needed for muscle spasms. 05/31/19   Mccrae Speciale E, PA-C  ezetimibe-simvastatin (VYTORIN) 10-20 MG per tablet Take 1 tablet by mouth at bedtime.    [provider]  ezetimibe-simvastatin (VYTORIN) 10-40 MG tablet Take 1 tablet by mouth daily.    [provider]  medroxyPROGESTERone (DEPO-PROVERA) 150 MG/ML injection Inject 150 mg into the muscle every 3 (three) months.    [provider]  Multiple Vitamin (MULTIVITAMIN) capsule Take 1 capsule by mouth daily.    [provider]  naproxen (NAPROSYN) 500 MG tablet Take 1 tablet (500 mg total) by mouth 2 (two) times daily as needed for up to 5 days. 05/31/19 06/05/19  Renell Allum, Verline Lema E, PA-C  omeprazole (PRILOSEC) 20 MG capsule Take 1 capsule (20 mg total) by mouth daily. 06/29/16   Horton, Barbette Hair, MD  propranolol ER (INDERAL LA) 80 MG 24 hr capsule Take 80 mg by mouth daily.    [provider]  valsartan (DIOVAN) 80 MG tablet Take 80 mg by mouth daily.    [provider]  valsartan-hydrochlorothiazide (DIOVAN HCT) 80-12.5 MG tablet Take 1 tablet by mouth daily.    [provider]  Vitamin D, Ergocalciferol, (DRISDOL) 50000 UNITS CAPS Take 50,000 Units by mouth.  [provider]    Allergies    Patient has no known allergies.  Review of Systems   Review of Systems  All other systems are reviewed and are negative for acute change except as noted in the HPI.   Physical Exam Updated Vital Signs BP 128/85 (BP Location: Right Arm)   Pulse 81   Temp 98.3 F (36.8 C) (Oral)   Resp 16   Ht 5\' 7"  (1.702 m)   Wt 79.2 kg   SpO2 98%   BMI 27.35 kg/m   Physical Exam Vitals and nursing note reviewed.  Constitutional:      General: She is not in acute  distress.    Appearance: She is not ill-appearing.  HENT:     Head: Normocephalic and atraumatic.     Right Ear: Tympanic membrane and external ear normal.     Left Ear: Tympanic membrane and external ear normal.     Nose: Nose normal.     Mouth/Throat:     Mouth: Mucous membranes are moist.     Pharynx: Oropharynx is clear.  Eyes:     General: No scleral icterus.       Right eye: No discharge.        Left eye: No discharge.     Extraocular Movements: Extraocular movements intact.     Conjunctiva/sclera: Conjunctivae normal.     Pupils: Pupils are equal, round, and reactive to light.  Neck:     Vascular: No JVD.  Cardiovascular:     Rate and Rhythm: Normal rate and regular rhythm.     Pulses: Normal pulses.          Radial pulses are 2+ on the right side and 2+ on the left side.       Dorsalis pedis pulses are 2+ on the right side and 2+ on the left side.     Heart sounds: Normal heart sounds.  Pulmonary:     Comments: Lungs clear to auscultation in all fields. Symmetric chest rise. No wheezing, rales, or rhonchi. Abdominal:     Tenderness: There is no right CVA tenderness or left CVA tenderness.     Comments: Abdomen is soft, non-distended, and non-tender in all quadrants. No rigidity, no guarding. No peritoneal signs.  Musculoskeletal:        General: Normal range of motion.     Cervical back: Normal range of motion.     Right lower leg: No edema.     Left lower leg: No edema.     Comments: Tenderness to palpation of right sacroiliac joint. Full ROM of right hip, knee, ankle. Pelvis is stable.  Full range of motion of the T-spine and L-spine No tenderness to palpation of the spinous processes of the T-spine or L-spine No crepitus, deformity or step-offs No tenderness to palpation of the paraspinous muscles of the L-spine   Negative straight leg raise test bilaterally.  Ambulates with steady gait.    Skin:    General: Skin is warm and dry.     Capillary Refill:  Capillary refill takes less than 2 seconds.  Neurological:     Mental Status: She is oriented to person, place, and time.     GCS: GCS eye subscore is 4. GCS verbal subscore is 5. GCS motor subscore is 6.     Comments: Fluent speech, no facial droop.  Psychiatric:        Behavior: Behavior normal.     ED Results / Procedures / Treatments  Labs (all labs ordered are listed, but only abnormal results are displayed) Labs Reviewed  URINALYSIS, ROUTINE W REFLEX MICROSCOPIC - Abnormal; Notable for the following components:      Result Value   APPearance CLOUDY (*)    Specific Gravity, Urine <1.005 (*)    Hgb urine dipstick TRACE (*)    Leukocytes,Ua SMALL (*)    All other components within normal limits  URINALYSIS, MICROSCOPIC (REFLEX) - Abnormal; Notable for the following components:   Bacteria, UA FEW (*)    All other components within normal limits  URINE CULTURE    EKG None  Radiology No results found.  Procedures Procedures (including critical care time)  Medications Ordered in ED Medications  naproxen (NAPROSYN) tablet 500 mg (500 mg Oral Given 05/31/19 2339)  cyclobenzaprine (FLEXERIL) tablet 5 mg (5 mg Oral Given 05/31/19 2339)    ED Course  I have reviewed the triage vital signs and the nursing notes.  Pertinent labs & imaging results that were available during my care of the patient were reviewed by me and considered in my medical decision making (see chart for details).    MDM Rules/Calculators/A&P                     Patient with back pain.  No neurological deficits and normal neuro exam.  Patient can walk but states is painful.Exam is suggestive of sacroilitiis.   No loss of bowel or bladder control.  No concern for cauda equina.  No fever, night sweats, weight loss, h/o cancer, IVDU. No CVA tenderness or abdominal pain.  UA collected given recent dysuria and has small leukocytes, 6-10 WBC. Also with squamous epithelial cells suggesting contaminated sample.   She does have trace blood without RBC, feel that kidney stone is unlikely cause of pain given how well appearing she is and lack of symptoms to suggest current stone.  Will send UA for culture and hold off on antibiotics now as patient is asymptomatic.  Will discharge home with symptomatic care including naproxen and flexeril. Patient aware not to drive/work while taking muscle relaxer as it can make her drowsy.  The patient appears reasonably screened and/or stabilized for discharge and I doubt any other medical condition or other [facility] requiring further screening, evaluation, or treatment in the ED at this time prior to discharge. The patient is safe for discharge with strict return precautions discussed. Recommend pcp follow up if symptoms persist.   Portions of this note were generated with Dragon dictation software. Dictation errors may occur despite best attempts at proofreading.  Final Clinical Impression(s) / ED Diagnoses Final diagnoses:  Acute right-sided low back pain without sciatica    Rx / DC Orders ED Discharge Orders         Ordered    cyclobenzaprine (FLEXERIL) 10 MG tablet  2 times daily PRN     05/31/19 2336    naproxen (NAPROSYN) 500 MG tablet  2 times daily PRN     05/31/19 2336           Cherre Robins, PA-C 06/01/19 1105    Wyvonnia Dusky, MD 06/01/19 1256

## 2019-05-31 NOTE — ED Triage Notes (Signed)
Pt states she woke yesterday morning with lower back pain-denies injury-pain worse with movement-NAD-slow gait

## 2019-06-02 LAB — URINE CULTURE: Culture: NO GROWTH

## 2019-10-29 ENCOUNTER — Other Ambulatory Visit: Payer: Self-pay

## 2019-10-29 DIAGNOSIS — I10 Essential (primary) hypertension: Secondary | ICD-10-CM | POA: Insufficient documentation

## 2019-10-29 DIAGNOSIS — Z7982 Long term (current) use of aspirin: Secondary | ICD-10-CM | POA: Diagnosis not present

## 2019-10-29 DIAGNOSIS — Z79899 Other long term (current) drug therapy: Secondary | ICD-10-CM | POA: Insufficient documentation

## 2019-10-29 DIAGNOSIS — Z20822 Contact with and (suspected) exposure to covid-19: Secondary | ICD-10-CM | POA: Insufficient documentation

## 2019-10-29 DIAGNOSIS — K8000 Calculus of gallbladder with acute cholecystitis without obstruction: Principal | ICD-10-CM | POA: Insufficient documentation

## 2019-10-29 DIAGNOSIS — R1011 Right upper quadrant pain: Secondary | ICD-10-CM | POA: Diagnosis present

## 2019-10-30 ENCOUNTER — Encounter (HOSPITAL_COMMUNITY)
Admission: EM | Disposition: A | Discharge status: Home / Self Care | Payer: Self-pay | Source: Home / Self Care | Attending: Emergency Medicine

## 2019-10-30 ENCOUNTER — Encounter (HOSPITAL_BASED_OUTPATIENT_CLINIC_OR_DEPARTMENT_OTHER): Payer: Self-pay | Admitting: Emergency Medicine

## 2019-10-30 ENCOUNTER — Observation Stay (HOSPITAL_COMMUNITY): Admitting: Certified Registered Nurse Anesthetist

## 2019-10-30 ENCOUNTER — Observation Stay (HOSPITAL_COMMUNITY)

## 2019-10-30 ENCOUNTER — Observation Stay (HOSPITAL_BASED_OUTPATIENT_CLINIC_OR_DEPARTMENT_OTHER)
Admission: EM | Admit: 2019-10-30 | Discharge: 2019-10-31 | Disposition: A | Discharge status: Home / Self Care | Attending: Surgery | Admitting: Surgery

## 2019-10-30 ENCOUNTER — Emergency Department (HOSPITAL_COMMUNITY)

## 2019-10-30 DIAGNOSIS — K810 Acute cholecystitis: Secondary | ICD-10-CM | POA: Diagnosis present

## 2019-10-30 DIAGNOSIS — K8000 Calculus of gallbladder with acute cholecystitis without obstruction: Secondary | ICD-10-CM | POA: Diagnosis not present

## 2019-10-30 DIAGNOSIS — K8020 Calculus of gallbladder without cholecystitis without obstruction: Secondary | ICD-10-CM

## 2019-10-30 DIAGNOSIS — Z7982 Long term (current) use of aspirin: Secondary | ICD-10-CM | POA: Diagnosis not present

## 2019-10-30 DIAGNOSIS — Z01818 Encounter for other preprocedural examination: Secondary | ICD-10-CM

## 2019-10-30 DIAGNOSIS — K819 Cholecystitis, unspecified: Secondary | ICD-10-CM

## 2019-10-30 DIAGNOSIS — R1011 Right upper quadrant pain: Secondary | ICD-10-CM | POA: Diagnosis present

## 2019-10-30 DIAGNOSIS — I10 Essential (primary) hypertension: Secondary | ICD-10-CM | POA: Diagnosis not present

## 2019-10-30 DIAGNOSIS — Z79899 Other long term (current) drug therapy: Secondary | ICD-10-CM | POA: Diagnosis not present

## 2019-10-30 DIAGNOSIS — Z20822 Contact with and (suspected) exposure to covid-19: Secondary | ICD-10-CM | POA: Diagnosis not present

## 2019-10-30 HISTORY — DX: Obstructive sleep apnea (adult) (pediatric): G47.33

## 2019-10-30 HISTORY — PX: CHOLECYSTECTOMY: SHX55

## 2019-10-30 LAB — LIPASE, BLOOD: Lipase: 38 U/L (ref 11–51)

## 2019-10-30 LAB — COMPREHENSIVE METABOLIC PANEL
ALT: 141 U/L — ABNORMAL HIGH (ref 0–44)
AST: 287 U/L — ABNORMAL HIGH (ref 15–41)
Albumin: 4 g/dL (ref 3.5–5.0)
Alkaline Phosphatase: 97 U/L (ref 38–126)
Anion gap: 10 (ref 5–15)
BUN: 15 mg/dL (ref 6–20)
CO2: 26 mmol/L (ref 22–32)
Calcium: 8.8 mg/dL — ABNORMAL LOW (ref 8.9–10.3)
Chloride: 106 mmol/L (ref 98–111)
Creatinine, Ser: 0.92 mg/dL (ref 0.44–1.00)
GFR calc Af Amer: >60 mL/min (ref >60)
GFR calc non Af Amer: >60 mL/min (ref >60)
Glucose, Bld: 136 mg/dL — ABNORMAL HIGH (ref 70–99)
Potassium: 3.4 mmol/L — ABNORMAL LOW (ref 3.5–5.1)
Sodium: 142 mmol/L (ref 135–145)
Total Bilirubin: 1.8 mg/dL — ABNORMAL HIGH (ref 0.3–1.2)
Total Protein: 7.1 g/dL (ref 6.5–8.1)

## 2019-10-30 LAB — CBC WITH DIFFERENTIAL/PLATELET
Abs Immature Granulocytes: 0.02 10*3/uL (ref 0.00–0.07)
Basophils Absolute: 0 10*3/uL (ref 0.0–0.1)
Basophils Relative: 0 %
Eosinophils Absolute: 0 10*3/uL (ref 0.0–0.5)
Eosinophils Relative: 0 %
HCT: 34.4 % — ABNORMAL LOW (ref 36.0–46.0)
Hemoglobin: 11.7 g/dL — ABNORMAL LOW (ref 12.0–15.0)
Immature Granulocytes: 0 %
Lymphocytes Relative: 8 %
Lymphs Abs: 0.7 10*3/uL (ref 0.7–4.0)
MCH: 27.9 pg (ref 26.0–34.0)
MCHC: 34 g/dL (ref 30.0–36.0)
MCV: 81.9 fL (ref 80.0–100.0)
Monocytes Absolute: 0.5 10*3/uL (ref 0.1–1.0)
Monocytes Relative: 6 %
Neutro Abs: 7.5 10*3/uL (ref 1.7–7.7)
Neutrophils Relative %: 86 %
Platelets: 272 10*3/uL (ref 150–400)
RBC: 4.2 MIL/uL (ref 3.87–5.11)
RDW: 13.4 % (ref 11.5–15.5)
WBC: 8.7 10*3/uL (ref 4.0–10.5)
nRBC: 0 % (ref 0.0–0.2)

## 2019-10-30 LAB — SARS CORONAVIRUS 2 BY RT PCR (HOSPITAL ORDER, PERFORMED IN ~~LOC~~ HOSPITAL LAB): SARS Coronavirus 2: NEGATIVE

## 2019-10-30 LAB — HIV ANTIBODY (ROUTINE TESTING W REFLEX): HIV Screen 4th Generation wRfx: NONREACTIVE

## 2019-10-30 SURGERY — LAPAROSCOPIC CHOLECYSTECTOMY WITH INTRAOPERATIVE CHOLANGIOGRAM
Anesthesia: General

## 2019-10-30 MED ORDER — MEPERIDINE HCL 50 MG/ML IJ SOLN: 6.2500 mg | INTRAMUSCULAR | Status: DC | PRN

## 2019-10-30 MED ORDER — PROPRANOLOL HCL ER 80 MG PO CP24
80.0000 mg | ORAL_CAPSULE | Freq: Every day | ORAL | Status: DC
  Administered 2019-10-31: 80 mg via ORAL
  Filled 2019-10-30 (×2): qty 1

## 2019-10-30 MED ORDER — PROMETHAZINE HCL 25 MG/ML IJ SOLN: 6.2500 mg | Freq: Once | INTRAMUSCULAR | Status: AC | PRN

## 2019-10-30 MED ORDER — ACETAMINOPHEN 500 MG PO TABS
1000.0000 mg | ORAL_TABLET | Freq: Four times a day (QID) | ORAL | Status: DC
  Administered 2019-10-30 – 2019-10-31 (×3): 1000 mg via ORAL
  Filled 2019-10-30 (×3): qty 2

## 2019-10-30 MED ORDER — FENTANYL CITRATE (PF) 100 MCG/2ML IJ SOLN
INTRAMUSCULAR | Status: DC | PRN
  Administered 2019-10-30 (×2): 50 ug via INTRAVENOUS
  Administered 2019-10-30: 100 ug via INTRAVENOUS
  Administered 2019-10-30: 50 ug via INTRAVENOUS

## 2019-10-30 MED ORDER — ONDANSETRON HCL 4 MG/2ML IJ SOLN
INTRAMUSCULAR | Status: AC
  Filled 2019-10-30: qty 2

## 2019-10-30 MED ORDER — FENTANYL CITRATE (PF) 250 MCG/5ML IJ SOLN
INTRAMUSCULAR | Status: AC
  Filled 2019-10-30: qty 5

## 2019-10-30 MED ORDER — IBUPROFEN 400 MG PO TABS: 600.0000 mg | ORAL_TABLET | Freq: Three times a day (TID) | ORAL | Status: DC | PRN

## 2019-10-30 MED ORDER — POLYETHYLENE GLYCOL 3350 17 G PO PACK: 17.0000 g | PACK | Freq: Every day | ORAL | Status: DC | PRN

## 2019-10-30 MED ORDER — LACTATED RINGERS IV SOLN: INTRAVENOUS | Status: DC | PRN

## 2019-10-30 MED ORDER — HYDROCODONE-ACETAMINOPHEN 5-325 MG PO TABS: 1.0000 | ORAL_TABLET | ORAL | Status: DC | PRN

## 2019-10-30 MED ORDER — PROPOFOL 10 MG/ML IV BOLUS
INTRAVENOUS | Status: AC
  Filled 2019-10-30: qty 20

## 2019-10-30 MED ORDER — SIMETHICONE 80 MG PO CHEW
40.0000 mg | CHEWABLE_TABLET | Freq: Four times a day (QID) | ORAL | Status: DC | PRN
  Filled 2019-10-30: qty 1

## 2019-10-30 MED ORDER — PHENYLEPHRINE HCL (PRESSORS) 10 MG/ML IV SOLN
INTRAVENOUS | Status: AC
  Filled 2019-10-30: qty 1

## 2019-10-30 MED ORDER — DEXAMETHASONE SODIUM PHOSPHATE 10 MG/ML IJ SOLN
INTRAMUSCULAR | Status: DC | PRN
  Administered 2019-10-30: 8 mg via INTRAVENOUS

## 2019-10-30 MED ORDER — SUGAMMADEX SODIUM 200 MG/2ML IV SOLN
INTRAVENOUS | Status: DC | PRN
  Administered 2019-10-30: 200 mg via INTRAVENOUS

## 2019-10-30 MED ORDER — MIDAZOLAM HCL 2 MG/2ML IJ SOLN
INTRAMUSCULAR | Status: AC
  Filled 2019-10-30: qty 2

## 2019-10-30 MED ORDER — BUPIVACAINE-EPINEPHRINE (PF) 0.25% -1:200000 IJ SOLN
INTRAMUSCULAR | Status: AC
  Filled 2019-10-30: qty 30

## 2019-10-30 MED ORDER — ALUM & MAG HYDROXIDE-SIMETH 200-200-20 MG/5ML PO SUSP: 30.0000 mL | ORAL | Status: DC | PRN

## 2019-10-30 MED ORDER — KCL IN DEXTROSE-NACL 20-5-0.45 MEQ/L-%-% IV SOLN
INTRAVENOUS | Status: DC
  Filled 2019-10-30: qty 1000

## 2019-10-30 MED ORDER — PHENYLEPHRINE HCL-NACL 10-0.9 MG/250ML-% IV SOLN
INTRAVENOUS | Status: DC | PRN
Start: 2019-10-30 — End: 2019-10-30
  Administered 2019-10-30: 40 ug/min via INTRAVENOUS

## 2019-10-30 MED ORDER — IOHEXOL 300 MG/ML  SOLN
INTRAMUSCULAR | Status: DC | PRN
  Administered 2019-10-30: 3 mL

## 2019-10-30 MED ORDER — BUPIVACAINE-EPINEPHRINE 0.25% -1:200000 IJ SOLN
INTRAMUSCULAR | Status: DC | PRN
  Administered 2019-10-30: 30 mL

## 2019-10-30 MED ORDER — METHOCARBAMOL 500 MG PO TABS: 500.0000 mg | ORAL_TABLET | Freq: Four times a day (QID) | ORAL | Status: DC | PRN

## 2019-10-30 MED ORDER — DIPHENHYDRAMINE HCL 50 MG/ML IJ SOLN: 25.0000 mg | Freq: Four times a day (QID) | INTRAMUSCULAR | Status: DC | PRN

## 2019-10-30 MED ORDER — PROPOFOL 10 MG/ML IV BOLUS
INTRAVENOUS | Status: DC | PRN
  Administered 2019-10-30: 110 mg via INTRAVENOUS

## 2019-10-30 MED ORDER — OXYCODONE HCL 5 MG PO TABS: 5.0000 mg | ORAL_TABLET | ORAL | Status: DC | PRN

## 2019-10-30 MED ORDER — LACTATED RINGERS IV SOLN: INTRAVENOUS | Status: DC

## 2019-10-30 MED ORDER — ONDANSETRON HCL 4 MG/2ML IJ SOLN
4.0000 mg | Freq: Once | INTRAMUSCULAR | Status: AC | PRN
  Administered 2019-10-30: 4 mg via INTRAVENOUS

## 2019-10-30 MED ORDER — ONDANSETRON 4 MG PO TBDP
4.0000 mg | ORAL_TABLET | Freq: Four times a day (QID) | ORAL | Status: DC | PRN
  Administered 2019-10-31: 4 mg via ORAL
  Filled 2019-10-30: qty 1

## 2019-10-30 MED ORDER — OLMESARTAN MEDOXOMIL-HCTZ 20-12.5 MG PO TABS: 1.0000 | ORAL_TABLET | Freq: Every day | ORAL | Status: DC

## 2019-10-30 MED ORDER — ONDANSETRON HCL 4 MG/2ML IJ SOLN: 4.0000 mg | Freq: Four times a day (QID) | INTRAMUSCULAR | Status: DC | PRN

## 2019-10-30 MED ORDER — ONDANSETRON HCL 4 MG/2ML IJ SOLN
INTRAMUSCULAR | Status: DC | PRN
  Administered 2019-10-30: 4 mg via INTRAVENOUS

## 2019-10-30 MED ORDER — DIPHENHYDRAMINE HCL 25 MG PO CAPS: 25.0000 mg | ORAL_CAPSULE | Freq: Four times a day (QID) | ORAL | Status: DC | PRN

## 2019-10-30 MED ORDER — MIDAZOLAM HCL 5 MG/5ML IJ SOLN
INTRAMUSCULAR | Status: DC | PRN
  Administered 2019-10-30: 2 mg via INTRAVENOUS

## 2019-10-30 MED ORDER — HYDROCHLOROTHIAZIDE 12.5 MG PO CAPS
12.5000 mg | ORAL_CAPSULE | Freq: Every day | ORAL | Status: DC
  Administered 2019-10-31: 12.5 mg via ORAL
  Filled 2019-10-30 (×2): qty 1

## 2019-10-30 MED ORDER — DOCUSATE SODIUM 100 MG PO CAPS
100.0000 mg | ORAL_CAPSULE | Freq: Two times a day (BID) | ORAL | Status: DC
  Administered 2019-10-30 – 2019-10-31 (×2): 100 mg via ORAL
  Filled 2019-10-30 (×2): qty 1

## 2019-10-30 MED ORDER — ROCURONIUM BROMIDE 10 MG/ML (PF) SYRINGE
PREFILLED_SYRINGE | INTRAVENOUS | Status: AC
  Filled 2019-10-30: qty 10

## 2019-10-30 MED ORDER — LIDOCAINE 2% (20 MG/ML) 5 ML SYRINGE
INTRAMUSCULAR | Status: AC
  Filled 2019-10-30: qty 5

## 2019-10-30 MED ORDER — IRBESARTAN 150 MG PO TABS
150.0000 mg | ORAL_TABLET | Freq: Every day | ORAL | Status: DC
  Administered 2019-10-31: 150 mg via ORAL
  Filled 2019-10-30 (×2): qty 1

## 2019-10-30 MED ORDER — LACTATED RINGERS IV SOLN
INTRAVENOUS | Status: AC | PRN
  Administered 2019-10-30: 1000 mL via INTRAVENOUS

## 2019-10-30 MED ORDER — PROMETHAZINE HCL 25 MG/ML IJ SOLN
INTRAMUSCULAR | Status: AC
  Administered 2019-10-30: 6.25 mg via INTRAVENOUS
  Filled 2019-10-30: qty 1

## 2019-10-30 MED ORDER — ROCURONIUM BROMIDE 10 MG/ML (PF) SYRINGE
PREFILLED_SYRINGE | INTRAVENOUS | Status: DC | PRN
  Administered 2019-10-30: 80 mg via INTRAVENOUS

## 2019-10-30 MED ORDER — AMISULPRIDE (ANTIEMETIC) 5 MG/2ML IV SOLN: 5.0000 mg | INTRAVENOUS | Status: AC | PRN

## 2019-10-30 MED ORDER — HYDROMORPHONE HCL 1 MG/ML IJ SOLN: 0.2500 mg | INTRAMUSCULAR | Status: DC | PRN

## 2019-10-30 MED ORDER — ENOXAPARIN SODIUM 40 MG/0.4ML ~~LOC~~ SOLN
40.0000 mg | SUBCUTANEOUS | Status: DC
  Administered 2019-10-31: 40 mg via SUBCUTANEOUS

## 2019-10-30 MED ORDER — DICYCLOMINE HCL 10 MG/ML IM SOLN
20.0000 mg | Freq: Once | INTRAMUSCULAR | Status: AC
  Administered 2019-10-30: 20 mg via INTRAMUSCULAR
  Filled 2019-10-30: qty 2

## 2019-10-30 MED ORDER — AMISULPRIDE (ANTIEMETIC) 5 MG/2ML IV SOLN
INTRAVENOUS | Status: AC
  Administered 2019-10-30: 5 mg via INTRAVENOUS
  Filled 2019-10-30: qty 2

## 2019-10-30 MED ORDER — LIDOCAINE 2% (20 MG/ML) 5 ML SYRINGE
INTRAMUSCULAR | Status: DC | PRN
  Administered 2019-10-30: 100 mg via INTRAVENOUS

## 2019-10-30 MED ORDER — MORPHINE SULFATE (PF) 2 MG/ML IV SOLN: 1.0000 mg | INTRAVENOUS | Status: DC | PRN

## 2019-10-30 MED ORDER — SODIUM CHLORIDE 0.9 % IV SOLN
2.0000 g | INTRAVENOUS | Status: DC
  Administered 2019-10-30: 2 g via INTRAVENOUS
  Filled 2019-10-30 (×2): qty 20

## 2019-10-30 SURGICAL SUPPLY — 42 items
APPLIER CLIP 5 13 M/L LIGAMAX5 (MISCELLANEOUS)
APPLIER CLIP ROT 10 11.4 M/L (STAPLE) ×3
CABLE HIGH FREQUENCY MONO STRZ (ELECTRODE) ×3 IMPLANT
CHLORAPREP W/TINT 26 (MISCELLANEOUS) ×3 IMPLANT
CHOLANGIOGRAM CATH TAUT (CATHETERS) ×3 IMPLANT
CLIP APPLIE 5 13 M/L LIGAMAX5 (MISCELLANEOUS) IMPLANT
CLIP APPLIE ROT 10 11.4 M/L (STAPLE) ×1 IMPLANT
CLOSURE WOUND 1/4X4 (GAUZE/BANDAGES/DRESSINGS)
COVER MAYO STAND STRL (DRAPES) ×3 IMPLANT
COVER SURGICAL LIGHT HANDLE (MISCELLANEOUS) ×3 IMPLANT
COVER WAND RF STERILE (DRAPES) IMPLANT
DECANTER SPIKE VIAL GLASS SM (MISCELLANEOUS) ×3 IMPLANT
DERMABOND ADVANCED (GAUZE/BANDAGES/DRESSINGS) ×2
DERMABOND ADVANCED .7 DNX12 (GAUZE/BANDAGES/DRESSINGS) ×1 IMPLANT
DRAPE C-ARM 42X120 X-RAY (DRAPES) ×3 IMPLANT
ELECT REM PT RETURN 15FT ADLT (MISCELLANEOUS) ×3 IMPLANT
GLOVE SURG SYN 7.5  E (GLOVE) ×2
GLOVE SURG SYN 7.5 E (GLOVE) ×1 IMPLANT
GOWN STRL REUS W/TWL XL LVL3 (GOWN DISPOSABLE) ×9 IMPLANT
HEMOSTAT SURGICEL 4X8 (HEMOSTASIS) IMPLANT
IV CATH 14GX2 1/4 (CATHETERS) ×3 IMPLANT
IV SET EXTENSION CATH 6 NF (IV SETS) ×3 IMPLANT
KIT BASIN OR (CUSTOM PROCEDURE TRAY) ×3 IMPLANT
KIT TURNOVER KIT A (KITS) IMPLANT
PENCIL SMOKE EVACUATOR (MISCELLANEOUS) IMPLANT
POUCH RETRIEVAL ECOSAC 10 (ENDOMECHANICALS) ×1 IMPLANT
POUCH RETRIEVAL ECOSAC 10MM (ENDOMECHANICALS) ×2
SCISSORS LAP 5X35 DISP (ENDOMECHANICALS) ×3 IMPLANT
SET IRRIG TUBING LAPAROSCOPIC (IRRIGATION / IRRIGATOR) ×3 IMPLANT
SET TUBE SMOKE EVAC HIGH FLOW (TUBING) ×3 IMPLANT
SLEEVE ADV FIXATION 5X100MM (TROCAR) ×3 IMPLANT
STOPCOCK 4 WAY LG BORE MALE ST (IV SETS) ×3 IMPLANT
STRIP CLOSURE SKIN 1/4X4 (GAUZE/BANDAGES/DRESSINGS) IMPLANT
SUT MNCRL AB 4-0 PS2 18 (SUTURE) ×3 IMPLANT
SUT VICRYL 0 UR6 27IN ABS (SUTURE) ×6 IMPLANT
SYR 10ML ECCENTRIC (SYRINGE) ×3 IMPLANT
TOWEL OR 17X26 10 PK STRL BLUE (TOWEL DISPOSABLE) ×3 IMPLANT
TOWEL OR NON WOVEN STRL DISP B (DISPOSABLE) ×3 IMPLANT
TRAY LAPAROSCOPIC (CUSTOM PROCEDURE TRAY) ×3 IMPLANT
TROCAR ADV FIXATION 11X100MM (TROCAR) IMPLANT
TROCAR ADV FIXATION 5X100MM (TROCAR) ×3 IMPLANT
TROCAR XCEL BLUNT TIP 100MML (ENDOMECHANICALS) ×3 IMPLANT

## 2019-10-30 NOTE — Anesthesia Preprocedure Evaluation (Signed)
Anesthesia Evaluation  Patient identified by MRN, date of birth, ID band Patient awake    Reviewed: Allergy & Precautions, NPO status , Patient's Chart, lab work & pertinent test results  Airway Mallampati: I  TM Distance: >3 FB Neck ROM: Full    Dental   Pulmonary sleep apnea ,    Pulmonary exam normal        Cardiovascular hypertension, Pt. on medications Normal cardiovascular exam     Neuro/Psych    GI/Hepatic   Endo/Other    Renal/GU      Musculoskeletal   Abdominal   Peds  Hematology   Anesthesia Other Findings   Reproductive/Obstetrics                             Anesthesia Physical Anesthesia Plan  ASA: II  Anesthesia Plan: General   Post-op Pain Management:    Induction: Intravenous  PONV Risk Score and Plan: 3 and Midazolam, Dexamethasone and Ondansetron  Airway Management Planned: Oral ETT  Additional Equipment:   Intra-op Plan:   Post-operative Plan: Extubation in OR  Informed Consent: I have reviewed the patients History and Physical, chart, labs and discussed the procedure including the risks, benefits and alternatives for the proposed anesthesia with the patient or authorized representative who has indicated his/her understanding and acceptance.       Plan Discussed with: CRNA and Surgeon  Anesthesia Plan Comments:         Anesthesia Quick Evaluation

## 2019-10-30 NOTE — Transfer of Care (Signed)
Immediate Anesthesia Transfer of Care Note  Patient: Christina Calderon  Procedure(s) Performed: LAPAROSCOPIC CHOLECYSTECTOMY WITH INTRAOPERATIVE CHOLANGIOGRAM (N/A )  Patient Location: PACU  Anesthesia Type:General  Level of Consciousness: drowsy and patient cooperative  Airway & Oxygen Therapy: Patient Spontanous Breathing and Patient connected to face mask oxygen  Post-op Assessment: Report given to RN and Post -op Vital signs reviewed and stable  Post vital signs: Reviewed and stable  Last Vitals:  Vitals Value Taken Time  BP 123/82 10/30/19 1830  Temp 36.6 C 10/30/19 1830  Pulse 71 10/30/19 1832  Resp 19 10/30/19 1832  SpO2 100 % 10/30/19 1832  Vitals shown include unvalidated device data.  Last Pain:  Vitals:   10/30/19 1830  TempSrc:   PainSc: (P) 0-No pain      Patients Stated Pain Goal: 4 (32/76/14 7092)  Complications: No complications documented.

## 2019-10-30 NOTE — Anesthesia Postprocedure Evaluation (Signed)
Anesthesia Post Note  Patient: Christina Calderon  Procedure(s) Performed: LAPAROSCOPIC CHOLECYSTECTOMY WITH INTRAOPERATIVE CHOLANGIOGRAM (N/A )     Patient location during evaluation: PACU Anesthesia Type: General Level of consciousness: awake and alert Pain management: pain level controlled Vital Signs Assessment: post-procedure vital signs reviewed and stable Respiratory status: spontaneous breathing, nonlabored ventilation, respiratory function stable and patient connected to nasal cannula oxygen Cardiovascular status: blood pressure returned to baseline and stable Postop Assessment: no apparent nausea or vomiting Anesthetic complications: no   No complications documented.  Last Vitals:  Vitals:   10/30/19 1915 10/30/19 1930  BP: 119/87 119/74  Pulse: 72 61  Resp: 15 20  Temp:  (!) 36.4 C  SpO2: 96% 100%    Last Pain:  Vitals:   10/30/19 1930  TempSrc:   PainSc: 0-No pain                 Huma Imhoff DAVID

## 2019-10-30 NOTE — ED Provider Notes (Signed)
5:28 AM Assumed care from Dr. Randal Buba at [facility], please see their note for full history, physical and decision making until this point. In brief this is a 59 y.o. year old female who presented to the ED tonight with Abdominal Pain      Apparently patient has been seen over the weekend at another facility mental that she had gallstones in her gallbladder and that she need to follow-up with a surgeon neurologist but her LFTs were normal at that time.  She had worsening pain and vomiting this evening and she went to [facility] where she was found to have elevated AST ALT and bilirubin.  Not able get ultrasound over there till 10:00 in the morning so was transferred to [facility] for ultrasound.  On arrival here her vital signs are within normal limits.  Patient appears well.  She has some mild right upper quadrant tenderness but no other abnormalities.  She does not feel she needs any pain medicine at this time.  Ultrasound done showing cholelithiasis with bladder wall thickening and pericholecystic fluid consistent with likely cholecystitis.  Will consult surgery for further management.  Patient not on blood thinners but does have history of hypertension and hyperlipidemia.  Surgery to see.   Labs, studies and imaging reviewed by myself and considered in medical decision making if ordered. Imaging interpreted by radiology.  Labs Reviewed  CBC WITH DIFFERENTIAL/PLATELET - Abnormal; Notable for the following components:      Result Value   Hemoglobin 11.7 (*)    HCT 34.4 (*)    All other components within normal limits  COMPREHENSIVE METABOLIC PANEL - Abnormal; Notable for the following components:   Potassium 3.4 (*)    Glucose, Bld 136 (*)    Calcium 8.8 (*)    AST 287 (*)    ALT 141 (*)    Total Bilirubin 1.8 (*)    All other components within normal limits  SARS CORONAVIRUS 2 BY RT PCR (HOSPITAL ORDER, [facility])  LIPASE, BLOOD     US Abdomen Limited  Final Result      No follow-ups on file.    Merrily Pew, MD 10/30/19 2252

## 2019-10-30 NOTE — ED Notes (Signed)
[facility] notified (Tammy) - patient ready for transport [facility] ED - Dr. Dayna Barker accepting

## 2019-10-30 NOTE — ED Triage Notes (Addendum)
abd pain since this morning, seen at an ED in [place] for same. N/V prior to arrival. Reports being told she has gallstones and needs local surgery consult.

## 2019-10-30 NOTE — H&P (Addendum)
Christina Calderon [birth year]  [id number].    Chief Complaint/Reason for Consult: RUQ abdominal pain  HPI:  This is a 59 yo [place] female with a history of HTN, HLD, occasional skipped beats, and OSA (doesn't use CPAP) who was at the beach this weekend with her husband when she woke up Sunday morning with RUQ abdominal pain.  She went to the ED there with full work up revealing gallstones, but nothing else such as cardiac, etc.  She was given a GI cocktail as well as pain meds which seemed to help.  She was feeling better and was discharged.  They drove back home and woke up last night around 1100pm with worsening RUQ abdominal pain.  She tried to eat some plain oatmeal which then she vomited up.  She denies fevers, but admits to chills.  She denies CP, SOB only secondary to abdominal pain, no dysuria, or any other symptoms. Nothing made her pain better and because it was worsening, she presented to [facility] ED where she had an [place] that revealed a gb wall of 38mm with a 4cm gallstone in the gallbladder.  Her pain has not improved except with medications.  Her AST/ALT/TB (1.8) are elevated, but all other labs are unremarkable.  We have been asked to see her for further evaluation and admission.  ROS: ROS: Please see HPI, otherwise all other systems have been reviewed and are negative.  History reviewed. No pertinent family history.  Past Medical History:  Diagnosis Date  . Hypercholesteremia   . Hypertension   . Migraine   . OSA (obstructive sleep apnea)   . Uterine fibroid   . Vitamin D deficiency     Past Surgical History:  Procedure Laterality Date  . CESAREAN SECTION     x2    Social History:  reports that she has never smoked. She has never used smokeless tobacco. She reports that she does not drink alcohol and does not use drugs.  Allergies: No Known Allergies  (Not in a hospital admission)    Physical Exam: Blood pressure 107/73, pulse 63, temperature 98.6 F (37 C),  temperature source Oral, resp. rate 18, weight 78.2 kg, SpO2 100 %. General: pleasant, WD, WN black female who is laying in bed in NAD HEENT: head is normocephalic, atraumatic.  Sclera are noninjected.  PERRL.  Ears and nose without any masses or lesions.  Mouth is pink and moist Heart: regular, rate, and rhythm.  Normal s1,s2. No obvious murmurs, gallops, or rubs noted.  Palpable radial and pedal pulses bilaterally Lungs: CTAB, no wheezes, rhonchi, or rales noted.  Respiratory effort nonlabored Abd: soft, tender in RUQ as expected, ND, +BS, no masses, hernias, or organomegaly MS: all 4 extremities are symmetrical with no cyanosis, clubbing, or edema. Skin: warm and dry with no masses, lesions, or rashes Neuro: Cranial nerves 2-12 grossly intact, sensation is normal throughout Psych: A&Ox3 with an appropriate affect.   Results for orders placed or performed during the hospital encounter of 10/30/19 (from the past 48 hour(s))  CBC with Differential/Platelet     Status: Abnormal   Collection Time: 10/30/19  1:37 AM  Result Value Ref Range   WBC 8.7 4.0 - 10.5 K/uL   RBC 4.20 3.87 - 5.11 MIL/uL   Hemoglobin 11.7 (L) 12.0 - 15.0 g/dL   HCT 34.4 (L) 36 - 46 %   MCV 81.9 80.0 - 100.0 fL   MCH 27.9 26.0 - 34.0 pg   MCHC 34.0 30.0 -  36.0 g/dL   RDW 13.4 11.5 - 15.5 %   Platelets 272 150 - 400 K/uL   nRBC 0.0 0.0 - 0.2 %   Neutrophils Relative % 86 %   Neutro Abs 7.5 1.7 - 7.7 K/uL   Lymphocytes Relative 8 %   Lymphs Abs 0.7 0.7 - 4.0 K/uL   Monocytes Relative 6 %   Monocytes Absolute 0.5 0 - 1 K/uL   Eosinophils Relative 0 %   Eosinophils Absolute 0.0 0 - 0 K/uL   Basophils Relative 0 %   Basophils Absolute 0.0 0 - 0 K/uL   Immature Granulocytes 0 %   Abs Immature Granulocytes 0.02 0.00 - 0.07 K/uL    Comment: Performed at [facility], [address] [place], [place] [address]  Comprehensive metabolic panel     Status: Abnormal   Collection Time: 10/30/19  1:37 AM    Result Value Ref Range   Sodium 142 135 - 145 mmol/L   Potassium 3.4 (L) 3.5 - 5.1 mmol/L   Chloride 106 98 - 111 mmol/L   CO2 26 22 - 32 mmol/L   Glucose, Bld 136 (H) 70 - 99 mg/dL    Comment: Glucose reference range applies only to samples taken after fasting for at least 8 hours.   BUN 15 6 - 20 mg/dL   Creatinine, Ser 0.92 0.44 - 1.00 mg/dL   Calcium 8.8 (L) 8.9 - 10.3 mg/dL   Total Protein 7.1 6.5 - 8.1 g/dL   Albumin 4.0 3.5 - 5.0 g/dL   AST 287 (H) 15 - 41 U/L   ALT 141 (H) 0 - 44 U/L   Alkaline Phosphatase 97 38 - 126 U/L   Total Bilirubin 1.8 (H) 0.3 - 1.2 mg/dL   GFR calc non Af Amer >60 >60 mL/min   GFR calc Af Amer >60 >60 mL/min   Anion gap 10 5 - 15    Comment: Performed at [facility], [address] [place], [place] [address]  Lipase, blood     Status: None   Collection Time: 10/30/19  1:37 AM  Result Value Ref Range   Lipase 38 11 - 51 U/L    Comment: Performed at [facility], [address] [place], [place] [address]  SARS Coronavirus 2 by RT PCR (hospital order, performed in [facility] hospital lab) Nasopharyngeal Nasopharyngeal Swab     Status: None   Collection Time: 10/30/19  2:23 AM   Specimen: Nasopharyngeal Swab  Result Value Ref Range   SARS Coronavirus 2 NEGATIVE NEGATIVE    Comment: (NOTE) SARS-CoV-2 target nucleic acids are NOT DETECTED.  The SARS-CoV-2 RNA is generally detectable in upper and lower respiratory specimens during the acute phase of infection. The lowest concentration of SARS-CoV-2 viral copies this assay can detect is 250 copies / mL. A negative result does not preclude SARS-CoV-2 infection and should not be used as the sole basis for treatment or other patient management decisions.  A negative result may occur with improper specimen collection / handling, submission of specimen other than nasopharyngeal swab, presence of viral mutation(s) within the areas targeted by this assay, and inadequate number of  viral copies (<250 copies / mL). A negative result must be combined with clinical observations, patient history, and epidemiological information.  Fact Sheet for Patients:   StrictlyIdeas.no  Fact Sheet for Healthcare Providers: BankingDealers.co.za  This test is not yet approved or  cleared by the [place]  FDA and has been authorized for detection and/or diagnosis of SARS-CoV-2 by FDA under an Emergency Use Authorization (EUA).  This EUA will remain in effect (meaning this test can be used) for the duration of the COVID-19 declaration under Section 564(b)(1) of the Act, 21 U.S.C. section 360bbb-3(b)(1), unless the authorization is terminated or revoked sooner.  Performed at [facility], [address], [place], [place] [address]    US Abdomen Limited  Result Date: 10/30/2019 EXAM: ULTRASOUND ABDOMEN LIMITED RIGHT UPPER QUADRANT COMPARISON:  None. FINDINGS: Gallbladder: 4 cm echogenic calculus noted in the gallbladder with associated acoustic shadowing. There is also moderate gallbladder wall thickening and a small sliver of pericholecystic fluid. The gallbladder wall measures up to 9 mm. No sonographic Murphy sign. Common bile duct: Diameter: 6.5 mm.  No common bile duct stones are identified. Liver: Normal hepatic echogenicity without focal lesions. Mild intrahepatic biliary dilatation. Portal vein is patent on color Doppler imaging with normal direction of blood flow towards the liver. Other: None. IMPRESSION: 1. 4 cm gallstone in the gallbladder. There is gallbladder wall thickening and edema and thus small amount of pericholecystic fluid suggesting acute cholecystitis. 2. Borderline common bile duct dilatation and minimal intrahepatic biliary dilatation. 3. No hepatic lesions. Electronically Signed   By: Marijo Sanes M.D.   On: 10/30/2019 05:09      Assessment/Plan HTN - resume home medications HLD OSA Occasional  arrhythmia  - cont home propanolol  Cholecystitis The patient will be admitted to observation.  Will try to get lap chole with IOC done later, but if bumped will be tomorrow.  This has been discussed with the patient.  She will be started on Rocephin given some wall thickening and concerns for cholecystitis and not just biliary colic.  She will be NPO, but clears if unable to proceed today.  I have explained the procedure, risks, and aftercare of cholecystectomy.  Risks include but are not limited to bleeding, infection, wound problems, anesthesia, diarrhea, bile leak, injury to common bile duct/liver/intestine.  She seems to understand and agrees to proceed.  FEN - NPO/IVFs VTE - lovenox to start tomorrow ID - Rocephin Admit - observation  Henreitta Cea, [facility] Surgery 10/30/2019, 8:23 AM Please see Amion for pager number during day hours 7:00am-4:30pm or 7:00am -11:30am on weekends  Agree with above. Her husband is with her.  I discussed with the patient the indications and risks of gall bladder surgery.  The primary risks of gall bladder surgery include, but are not limited to, bleeding, infection, common bile duct injury, and open surgery.  There is also the risk that the patient may have continued symptoms after surgery.  We discussed the typical post-operative recovery course. I tried to answer the patient's questions. We will proceed with gall bladder surgery this afternoon.  Alphonsa Overall, MD, [facility] Surgery Office phone:  [phone]

## 2019-10-30 NOTE — Anesthesia Procedure Notes (Signed)
Procedure Name: Intubation Date/Time: 10/30/2019 4:23 PM Performed by: Montel Clock, CRNA Pre-anesthesia Checklist: Patient identified, Emergency Drugs available, Suction available, Patient being monitored and Timeout performed Patient Re-evaluated:Patient Re-evaluated prior to induction Oxygen Delivery Method: Circle system utilized Preoxygenation: Pre-oxygenation with 100% oxygen Induction Type: IV induction Ventilation: Mask ventilation without difficulty Laryngoscope Size: Mac and 3 Grade View: Grade I Tube type: Oral Tube size: 7.0 mm Number of attempts: 1 Airway Equipment and Method: Stylet Placement Confirmation: ETT inserted through vocal cords under direct vision,  positive ETCO2 and breath sounds checked- equal and bilateral Secured at: 21 cm Tube secured with: Tape Dental Injury: Teeth and Oropharynx as per pre-operative assessment

## 2019-10-30 NOTE — ED Notes (Signed)
Transport at bedside to go to OR.

## 2019-10-30 NOTE — ED Provider Notes (Signed)
[facility] EMERGENCY DEPARTMENT Provider Note   CSN: [id number] Arrival date & time: 10/29/19  2347     History Chief Complaint  Patient presents with  . Abdominal Pain    Christina Calderon is a 59 y.o. female.  The history is provided by the patient.  Abdominal Pain Pain location:  RUQ Pain quality: aching   Pain radiates to:  Does not radiate Pain severity:  Severe Onset quality:  Gradual Duration:  1 day Timing:  Constant Progression:  Worsening Chronicity:  New Context: not alcohol use, not diet changes and not laxative use   Relieved by:  Nothing Worsened by:  Nothing Ineffective treatments:  None tried Associated symptoms: nausea and vomiting   Associated symptoms: no anorexia, no belching, no chest pain, no chills, no constipation, no cough and no diarrhea   Risk factors: no alcohol abuse   Patient was seen at an OSH and was found to have a stone in the fundus of the gall bladder and went home and then the pain returned along with vomiting.       Past Medical History:  Diagnosis Date  . Hypercholesteremia   . Hypertension   . Migraine   . Uterine fibroid   . Vitamin D deficiency     There are no problems to display for this patient.   History reviewed. No pertinent surgical history.   OB History   No obstetric history on file.     History reviewed. No pertinent family history.  Social History   Tobacco Use  . Smoking status: Never Smoker  . Smokeless tobacco: Never Used  Vaping Use  . Vaping Use: Never used  Substance Use Topics  . Alcohol use: No  . Drug use: No    Home Medications Prior to Admission medications   Medication Sig Start Date End Date Taking? Authorizing Provider  aspirin-acetaminophen-caffeine (EXCEDRIN MIGRAINE) [phone] MG per tablet Take 1 tablet by mouth every 6 (six) hours as needed.    [provider]  cyclobenzaprine (FLEXERIL) 10 MG tablet Take 1 tablet (10 mg total) by mouth 2 (two)  times daily as needed for muscle spasms. 05/31/19   Albrizze, Kaitlyn E, PA-C  ezetimibe-simvastatin (VYTORIN) 10-20 MG per tablet Take 1 tablet by mouth at bedtime.    [provider]  ezetimibe-simvastatin (VYTORIN) 10-40 MG tablet Take 1 tablet by mouth daily.    [provider]  medroxyPROGESTERone (DEPO-PROVERA) 150 MG/ML injection Inject 150 mg into the muscle every 3 (three) months.    [provider]  Multiple Vitamin (MULTIVITAMIN) capsule Take 1 capsule by mouth daily.    [provider]  omeprazole (PRILOSEC) 20 MG capsule Take 1 capsule (20 mg total) by mouth daily. 06/29/16   Horton, Barbette Hair, MD  propranolol ER (INDERAL LA) 80 MG 24 hr capsule Take 80 mg by mouth daily.    [provider]  valsartan (DIOVAN) 80 MG tablet Take 80 mg by mouth daily.    [provider]  valsartan-hydrochlorothiazide (DIOVAN HCT) 80-12.5 MG tablet Take 1 tablet by mouth daily.    [provider]  Vitamin D, Ergocalciferol, (DRISDOL) 50000 UNITS CAPS Take 50,000 Units by mouth.    [provider]    Allergies    Patient has no known allergies.  Review of Systems   Review of Systems  Constitutional: Negative for chills.  HENT: Negative for congestion.   Eyes: Negative for visual disturbance.  Respiratory: Negative for cough.   Cardiovascular: Negative  for chest pain.  Gastrointestinal: Positive for abdominal pain, nausea and vomiting. Negative for anorexia, constipation and diarrhea.  Genitourinary: Negative for difficulty urinating.  Musculoskeletal: Negative for arthralgias.  Skin: Negative for rash.  Neurological: Negative for dizziness.  Psychiatric/Behavioral: Negative for agitation.  All other systems reviewed and are negative.   Physical Exam Updated Vital Signs BP 126/81   Pulse 90   Temp 99.6 F (37.6 C)   Resp 20   Wt 78.2 kg   SpO2 100%   BMI 27.00 kg/m   Physical Exam Vitals and nursing note  reviewed.  Constitutional:      Appearance: Normal appearance. She is not diaphoretic.  HENT:     Head: Normocephalic and atraumatic.     Nose: Nose normal.     Mouth/Throat:     Mouth: Mucous membranes are moist.  Eyes:     Conjunctiva/sclera: Conjunctivae normal.     Pupils: Pupils are equal, round, and reactive to light.  Cardiovascular:     Rate and Rhythm: Normal rate and regular rhythm.     Pulses: Normal pulses.     Heart sounds: Normal heart sounds.  Pulmonary:     Effort: Pulmonary effort is normal.     Breath sounds: Normal breath sounds.  Abdominal:     General: Abdomen is flat. Bowel sounds are normal.     Tenderness: There is abdominal tenderness. Positive signs include Murphy's sign.  Musculoskeletal:        General: Normal range of motion.     Cervical back: Normal range of motion and neck supple.  Skin:    General: Skin is warm and dry.     Capillary Refill: Capillary refill takes less than 2 seconds.  Neurological:     General: No focal deficit present.     Mental Status: She is alert and oriented to person, place, and time.     Deep Tendon Reflexes: Reflexes normal.  Psychiatric:        Mood and Affect: Mood normal.        Behavior: Behavior normal.     ED Results / Procedures / Treatments   Labs (all labs ordered are listed, but only abnormal results are displayed) Results for orders placed or performed during the hospital encounter of 10/30/19  CBC with Differential/Platelet  Result Value Ref Range   WBC 8.7 4.0 - 10.5 K/uL   RBC 4.20 3.87 - 5.11 MIL/uL   Hemoglobin 11.7 (L) 12.0 - 15.0 g/dL   HCT 34.4 (L) 36 - 46 %   MCV 81.9 80.0 - 100.0 fL   MCH 27.9 26.0 - 34.0 pg   MCHC 34.0 30.0 - 36.0 g/dL   RDW 13.4 11.5 - 15.5 %   Platelets 272 150 - 400 K/uL   nRBC 0.0 0.0 - 0.2 %   Neutrophils Relative % 86 %   Neutro Abs 7.5 1.7 - 7.7 K/uL   Lymphocytes Relative 8 %   Lymphs Abs 0.7 0.7 - 4.0 K/uL   Monocytes Relative 6 %   Monocytes Absolute  0.5 0 - 1 K/uL   Eosinophils Relative 0 %   Eosinophils Absolute 0.0 0 - 0 K/uL   Basophils Relative 0 %   Basophils Absolute 0.0 0 - 0 K/uL   Immature Granulocytes 0 %   Abs Immature Granulocytes 0.02 0.00 - 0.07 K/uL  Comprehensive metabolic panel  Result Value Ref Range   Sodium 142 135 - 145 mmol/L   Potassium 3.4 (L) 3.5 -  5.1 mmol/L   Chloride 106 98 - 111 mmol/L   CO2 26 22 - 32 mmol/L   Glucose, Bld 136 (H) 70 - 99 mg/dL   BUN 15 6 - 20 mg/dL   Creatinine, Ser 0.92 0.44 - 1.00 mg/dL   Calcium 8.8 (L) 8.9 - 10.3 mg/dL   Total Protein 7.1 6.5 - 8.1 g/dL   Albumin 4.0 3.5 - 5.0 g/dL   AST 287 (H) 15 - 41 U/L   ALT 141 (H) 0 - 44 U/L   Alkaline Phosphatase 97 38 - 126 U/L   Total Bilirubin 1.8 (H) 0.3 - 1.2 mg/dL   GFR calc non Af Amer >60 >60 mL/min   GFR calc Af Amer >60 >60 mL/min   Anion gap 10 5 - 15  Lipase, blood  Result Value Ref Range   Lipase 38 11 - 51 U/L   No results found.  Radiology No results found.  Procedures Procedures (including critical care time)  Medications Ordered in ED Medications - No data to display  ED Course  I have reviewed the triage vital signs and the nursing notes.  Pertinent labs & imaging results that were available during my care of the patient were reviewed by me and considered in my medical decision making (see chart for details).    Seen for the second time today.  Has murphy sign. New changes in LFTs.    240 Case d/w Dr. Dayna Barker who will accept the patient in transfer.    Christina Calderon was evaluated in Emergency Department on 10/30/2019 for the symptoms described in the history of present illness. She was evaluated in the context of the global COVID-19 pandemic, which necessitated consideration that the patient might be at risk for infection with the SARS-CoV-2 virus that causes COVID-19. Institutional protocols and algorithms that pertain to the evaluation of patients at risk for COVID-19 are in a state of rapid  change based on information released by regulatory bodies including the CDC and federal and state organizations. These policies and algorithms were followed during the patient's care in the ED.  Final Clinical Impression(s) / ED Diagnoses RUQ pain, transfer to [facility] for [place] and surgery consult    Olimpia Tinch, MD 10/30/19 [id number]

## 2019-10-30 NOTE — Op Note (Signed)
10/30/2019  6:17 PM  PATIENT:  Christina Calderon, 59 y.o., female, MRN: [id number]  PREOP DIAGNOSIS:  cholecystitis  POSTOP DIAGNOSIS:   Acute cholecystitis, cholelithiasis (4 cm gall stone)  PROCEDURE:   Procedure(s): LAPAROSCOPIC CHOLECYSTECTOMY WITH INTRAOPERATIVE CHOLANGIOGRAM  SURGEON:   Alphonsa Overall, M.D.  ASSISTANT:   None  ANESTHESIA:   general  Anesthesiologist: Lillia Abed, MD CRNA: Montel Clock, CRNA  General  ASA: 2 Emergent  EBL:  minimal  ml  BLOOD ADMINISTERED: none  DRAINS: none   LOCAL MEDICATIONS USED:   30 cc of 1/4% marcaine  SPECIMEN:   Gall bladder with large stone  COUNTS CORRECT:  YES  INDICATIONS FOR PROCEDURE:  Christina Calderon is a 59 y.o. (DOB: 1961/02/11) AA female whose primary care physician is Hermine Messick, MD and comes for cholecystectomy.   The indications and risks of the gall bladder surgery were explained to the patient.  The risks include, but are not limited to, infection, bleeding, common bile duct injury and open surgery.  SURGERY:  The patient was taken to OR room #[facility] at [facility].  The abdomen was prepped with chloroprep.  The patient was given Rocephin prior to the beginning of the operation.   A time out was held and the surgical checklist run.   An infraumbilical incision was made into the abdominal cavity.  A 12 mm Hasson trocar was inserted into the abdominal cavity through the infraumbilical incision and secured with a 0 Vicryl suture.  Three additional trocars were inserted: a 10 mm trocar in the sub-xiphoid location, a 5 mm trocar in the right mid subcostal area, and a 5 mm trocar in the right lateral subcostal area.   The abdomen was explored and the liver, stomach, and bowel that could be seen were unremarkable.   The gall bladder was encased in omentum and was acutely inflamed.  There was a large gall stone that look like it had impacted at the neck of the gall bladder.  I was able to milk  this stone up into the body of the gall bladder.   I grasped the gall bladder and rotated it cephalad.  Disssection was carried down to the gall bladder/cystic duct junction and the cystic duct isolated.  A clip was placed on the gall bladder side of the cystic duct.   An intra-operative cholangiogram was shot.   The intra-operative cholangiogram was shot using a cut off Taut catheter placed through a 14 gauge angiocath in the RUQ.  The Taut catheter was inserted in the cut cystic duct and secured with an endoclip.  A cholangiogram was shot with 8 cc of 1/2 strength Isoview.  Using fluoroscopy, the cholangiogram showed the flow of contrast into the common bile duct, up the hepatic radicals, and into the duodenum.  There was no mass or obstruction.  This was a normal intra-operative cholangiogram.   The Taut catheter was removed.  The cystic duct was tripley endoclipped and the cystic artery was identified and clipped.  The gall bladder was bluntly and sharpley dissected from the gall bladder bed.   After the gall bladder was removed from the liver, the gall bladder bed and Triangle of [place] were inspected.  There was no bleeding or bile leak.  The gall bladder was placed in a Ecco Sac bag and delivered through the umbilicus.  I had to enlarge the umbilical incision to get the gall stone out.  The stone was about 4 x 3 cm.   The  abdomen was irrigated with 2,000 cc saline.   The trocars were then removed.  I infiltrated 30 cc of 1/4% Marcaine into the incisions.  The umbilical port closed with several 0 Vicryl sutures and the skin closed with 4-0 Monocryl.  The skin was painted with DermaBond.  The patient's sponge and needle count were correct.  The patient was transported to the RR in good condition.  Alphonsa Overall, MD, [facility] Surgery Pager: [phone] Office phone:  [phone]

## 2019-10-31 ENCOUNTER — Encounter (HOSPITAL_COMMUNITY): Payer: Self-pay | Admitting: Surgery

## 2019-10-31 MED ORDER — OXYCODONE HCL 5 MG PO TABS: 5.0000 mg | ORAL_TABLET | ORAL | 0 refills | Status: AC | PRN

## 2019-10-31 MED ORDER — ACETAMINOPHEN 500 MG PO TABS: 1000.0000 mg | ORAL_TABLET | Freq: Four times a day (QID) | ORAL | 0 refills | Status: AC | PRN

## 2019-10-31 NOTE — Discharge Summary (Addendum)
Patient ID: Christina Calderon [id number] 05-10-1960 59 y.o.  Admit date: 10/30/2019 Discharge date: 10/31/2019  Admitting Diagnosis: cholecystitis  Discharge Diagnosis Patient Active Problem List   Diagnosis Date Noted  . Acute cholecystitis 10/30/2019  s/p lap chole with IOC  Consultants none  Reason for Admission: This is a 59 yo [place] female with a history of HTN, HLD, occasional skipped beats, and OSA (doesn't use CPAP) who was at the beach this weekend with her husband when she woke up Sunday morning with RUQ abdominal pain.  She went to the ED there with full work up revealing gallstones, but nothing else such as cardiac, etc.  She was given a GI cocktail as well as pain meds which seemed to help.  She was feeling better and was discharged.  They drove back home and woke up last night around 1100pm with worsening RUQ abdominal pain.  She tried to eat some plain oatmeal which then she vomited up.  She denies fevers, but admits to chills.  She denies CP, SOB only secondary to abdominal pain, no dysuria, or any other symptoms. Nothing made her pain better and because it was worsening, she presented to [facility] ED where she had an [place] that revealed a gb wall of 46mm with a 4cm gallstone in the gallbladder.  Her pain has not improved except with medications.  Her AST/ALT/TB (1.8) are elevated, but all other labs are unremarkable.  We have been asked to see her for further evaluation and admission.  Procedures Lap chole with IOC, Dr. Lucia Gaskins 7/20  Hospital Course:  The patient was admitted and underwent a laparoscopic cholecystectomy with IOC.  The patient tolerated the procedure well.  On POD 1, the patient was tolerating a regular diet, voiding well, mobilizing, and pain was controlled with oral pain medications.  The patient was stable for DC home at this time with appropriate follow up made.  Physical Exam: Abd: soft, appropriately tender, incisions c/d/i, +BS  Allergies as of  10/31/2019   No Known Allergies     Medication List    TAKE these medications   acetaminophen 500 MG tablet Commonly known as: TYLENOL Take 2 tablets (1,000 mg total) by mouth every 6 (six) hours as needed.   cyclobenzaprine 10 MG tablet Commonly known as: FLEXERIL Take 1 tablet (10 mg total) by mouth 2 (two) times daily as needed for muscle spasms.   ezetimibe-simvastatin 10-40 MG tablet Commonly known as: VYTORIN Take 1 tablet by mouth daily.   multivitamin capsule Take 1 capsule by mouth daily.   olmesartan-hydrochlorothiazide 20-12.5 MG tablet Commonly known as: BENICAR HCT Take 1 tablet by mouth daily.   omeprazole 20 MG capsule Commonly known as: PRILOSEC Take 1 capsule (20 mg total) by mouth daily.   ondansetron 4 MG disintegrating tablet Commonly known as: ZOFRAN-ODT Take 4 mg by mouth every 8 (eight) hours as needed for nausea or vomiting.   oxyCODONE 5 MG immediate release tablet Commonly known as: Oxy IR/ROXICODONE Take 1 tablet (5 mg total) by mouth every 4 (four) hours as needed for moderate pain.   propranolol ER 80 MG 24 hr capsule Commonly known as: INDERAL LA Take 80 mg by mouth daily.   sucralfate 1 g tablet Commonly known as: CARAFATE Take 1 g by mouth in the morning, at noon, in the evening, and at bedtime.   valsartan 80 MG tablet Commonly known as: DIOVAN Take 80 mg by mouth daily.   Vitamin D (Ergocalciferol) 1.25 MG (50000 UNIT)  Caps capsule Commonly known as: DRISDOL Take 50,000 Units by mouth every 7 (seven) days.         Follow-up Information    [facility] Follow up on 11/21/2019.   Specialty: General Surgery Why: 9:45am, arrive by 9:15am for check in and paperwork.  please bring insurance card and photo ID Contact information: [address] [place] [address] [phone]              Signed: Saverio Danker, [facility] 10/31/2019, 10:07 AM Please see Amion for pager  number during day hours 7:00am-4:30pm, 7-11:30am on Weekends  Agree with above. She is doing well and ready to go home.  Her husband is in the room.  Christina Overall, MD, [facility] Surgery Office phone:  [phone]

## 2019-10-31 NOTE — Discharge Instructions (Signed)
[facility]  Please arrive at least 30 min before your appointment to complete your check in paperwork.  If you are unable to arrive 30 min prior to your appointment time we may have to cancel or reschedule you. LAPAROSCOPIC SURGERY: POST OP INSTRUCTIONS Always review your discharge instruction sheet given to you by the facility where your surgery was performed. IF YOU HAVE DISABILITY OR FAMILY LEAVE FORMS, YOU MUST BRING THEM TO THE OFFICE FOR PROCESSING.   DO NOT GIVE THEM TO YOUR DOCTOR.  PAIN CONTROL  1. First take acetaminophen (Tylenol) AND/or ibuprofen (Advil) to control your pain after surgery.  Follow directions on package.  Taking acetaminophen (Tylenol) and/or ibuprofen (Advil) regularly after surgery will help to control your pain and lower the amount of prescription pain medication you may need.  You should not take more than 4,000 mg (4 grams) of acetaminophen (Tylenol) in 24 hours.  You should not take ibuprofen (Advil), aleve, motrin, naprosyn or other NSAIDS if you have a history of stomach ulcers or chronic kidney disease.  2. A prescription for pain medication may be given to you upon discharge.  Take your pain medication as prescribed, if you still have uncontrolled pain after taking acetaminophen (Tylenol) or ibuprofen (Advil). 3. Use ice packs to help control pain. 4. If you need a refill on your pain medication, please contact your pharmacy.  They will contact our office to request authorization. Prescriptions will not be filled after 5pm or on week-ends.  HOME MEDICATIONS 5. Take your usually prescribed medications unless otherwise directed.  DIET 6. You should follow a light diet the first few days after arrival home.  Be sure to include lots of fluids daily. Avoid fatty, fried foods.   CONSTIPATION 7. It is common to experience some constipation after surgery and if you are taking pain medication.  Increasing fluid intake and taking a stool  softener (such as Colace) will usually help or prevent this problem from occurring.  A mild laxative (Milk of Magnesia or Miralax) should be taken according to package instructions if there are no bowel movements after 48 hours.  WOUND/INCISION CARE 8. Most patients will experience some swelling and bruising in the area of the incisions.  Ice packs will help.  Swelling and bruising can take several days to resolve.  9. Unless discharge instructions indicate otherwise, follow guidelines below  a. STERI-STRIPS - you may remove your outer bandages 48 hours after surgery, and you may shower at that time.  You have steri-strips (small skin tapes) in place directly over the incision.  These strips should be left on the skin for 7-10 days.   b. DERMABOND/SKIN GLUE - you may shower in 24 hours.  The glue will flake off over the next 2-3 weeks. 10. Any sutures or staples will be removed at the office during your follow-up visit.  ACTIVITIES 11. You may resume regular (light) daily activities beginning the next day--such as daily self-care, walking, climbing stairs--gradually increasing activities as tolerated.  You may have sexual intercourse when it is comfortable.  Refrain from any heavy lifting or straining until approved by your doctor. a. You may drive when you are no longer taking prescription pain medication, you can comfortably wear a seatbelt, and you can safely maneuver your car and apply brakes.  FOLLOW-UP 12. You should see your doctor in the office for a follow-up appointment approximately 2-3 weeks after your surgery.  You should have been given your post-op/follow-up appointment when   your surgery was scheduled.  If you did not receive a post-op/follow-up appointment, make sure that you call for this appointment within a day or two after you arrive home to insure a convenient appointment time.   WHEN TO CALL YOUR DOCTOR: 1. Fever over 101.0 2. Inability to urinate 3. Continued bleeding from  incision. 4. Increased pain, redness, or drainage from the incision. 5. Increasing abdominal pain  The clinic staff is available to answer your questions during regular business hours.  Please don't hesitate to call and ask to speak to one of the nurses for clinical concerns.  If you have a medical emergency, go to the nearest emergency room or call 911.  A surgeon from [facility] is always on call at the hospital. [address], [address], [place], [place]  [address] ? [address] [address], [place], [place]   [address] [phone] ? [phone] ? FAX [phone]  .........   Managing Your Pain After Surgery Without Opioids    Thank you for participating in our program to help patients manage their pain after surgery without opioids. This is part of our effort to provide you with the best care possible, without exposing you or your family to the risk that opioids pose.  What pain can I expect after surgery? You can expect to have some pain after surgery. This is normal. The pain is typically worse the day after surgery, and quickly begins to get better. Many studies have found that many patients are able to manage their pain after surgery with Over-the-Counter (OTC) medications such as Tylenol and Motrin. If you have a condition that does not allow you to take Tylenol or Motrin, notify your surgical team.  How will I manage my pain? The best strategy for controlling your pain after surgery is around the clock pain control with Tylenol (acetaminophen) and Motrin (ibuprofen or Advil). Alternating these medications with each other allows you to maximize your pain control. In addition to Tylenol and Motrin, you can use heating pads or ice packs on your incisions to help reduce your pain.  How will I alternate your regular strength over-the-counter pain medication? You will take a dose of pain medication every three hours. ; Start by taking 650 mg of Tylenol (2 pills of 325  mg) ; 3 hours later take 600 mg of Motrin (3 pills of 200 mg) ; 3 hours after taking the Motrin take 650 mg of Tylenol ; 3 hours after that take 600 mg of Motrin.   - 1 -  See example - if your first dose of Tylenol is at 12:00 PM   12:00 PM Tylenol 650 mg (2 pills of 325 mg)  3:00 PM Motrin 600 mg (3 pills of 200 mg)  6:00 PM Tylenol 650 mg (2 pills of 325 mg)  9:00 PM Motrin 600 mg (3 pills of 200 mg)  Continue alternating every 3 hours   We recommend that you follow this schedule around-the-clock for at least 3 days after surgery, or until you feel that it is no longer needed. Use the table on the last page of this handout to keep track of the medications you are taking. Important: Do not take more than 3000mg of Tylenol or 3200mg of Motrin in a 24-hour period. Do not take ibuprofen/Motrin if you have a history of bleeding stomach ulcers, severe kidney disease, &/or actively taking a blood thinner  What if I still have pain? If you have pain that is not   controlled with the over-the-counter pain medications (Tylenol and Motrin or Advil) you might have what we call "breakthrough" pain. You will receive a prescription for a small amount of an opioid pain medication such as Oxycodone, Tramadol, or Tylenol with Codeine. Use these opioid pills in the first 24 hours after surgery if you have breakthrough pain. Do not take more than 1 pill every 4-6 hours.  If you still have uncontrolled pain after using all opioid pills, don't hesitate to call our staff using the number provided. We will help make sure you are managing your pain in the best way possible, and if necessary, we can provide a prescription for additional pain medication.   Day 1    Time  Name of Medication Number of pills taken  Amount of Acetaminophen  Pain Level   Comments  AM PM       AM PM       AM PM       AM PM       AM PM       AM PM       AM PM       AM PM       Total Daily amount of Acetaminophen Do not  take more than  3,000 mg per day      Day 2    Time  Name of Medication Number of pills taken  Amount of Acetaminophen  Pain Level   Comments  AM PM       AM PM       AM PM       AM PM       AM PM       AM PM       AM PM       AM PM       Total Daily amount of Acetaminophen Do not take more than  3,000 mg per day      Day 3    Time  Name of Medication Number of pills taken  Amount of Acetaminophen  Pain Level   Comments  AM PM       AM PM       AM PM       AM PM          AM PM       AM PM       AM PM       AM PM       Total Daily amount of Acetaminophen Do not take more than  3,000 mg per day      Day 4    Time  Name of Medication Number of pills taken  Amount of Acetaminophen  Pain Level   Comments  AM PM       AM PM       AM PM       AM PM       AM PM       AM PM       AM PM       AM PM       Total Daily amount of Acetaminophen Do not take more than  3,000 mg per day      Day 5    Time  Name of Medication Number of pills taken  Amount of Acetaminophen  Pain Level   Comments  AM PM       AM PM       AM   PM       AM PM       AM PM       AM PM       AM PM       AM PM       Total Daily amount of Acetaminophen Do not take more than  3,000 mg per day       Day 6    Time  Name of Medication Number of pills taken  Amount of Acetaminophen  Pain Level  Comments  AM PM       AM PM       AM PM       AM PM       AM PM       AM PM       AM PM       AM PM       Total Daily amount of Acetaminophen Do not take more than  3,000 mg per day      Day 7    Time  Name of Medication Number of pills taken  Amount of Acetaminophen  Pain Level   Comments  AM PM       AM PM       AM PM       AM PM       AM PM       AM PM       AM PM       AM PM       Total Daily amount of Acetaminophen Do not take more than  3,000 mg per day        For additional information about how and where to safely dispose of unused  opioid medications - https://www.morepowerfulnc.org  Disclaimer: This document contains information and/or instructional materials adapted from [facility] for the typical patient with your condition. It does not replace medical advice from your health care provider because your experience may differ from that of the typical patient. Talk to your health care provider if you have any questions about this document, your condition or your treatment plan. Adapted from [facility]   

## 2019-11-01 LAB — SURGICAL PATHOLOGY

## 2021-11-27 IMAGING — US US ABDOMEN LIMITED
1 series · 14 of 25 positions shown · non-contrast
Comparison: None.

EXAM:
ULTRASOUND ABDOMEN LIMITED RIGHT UPPER QUADRANT

[Series 1: us abdomen limited · 14 of 57 slices shown]
[im 1/57]
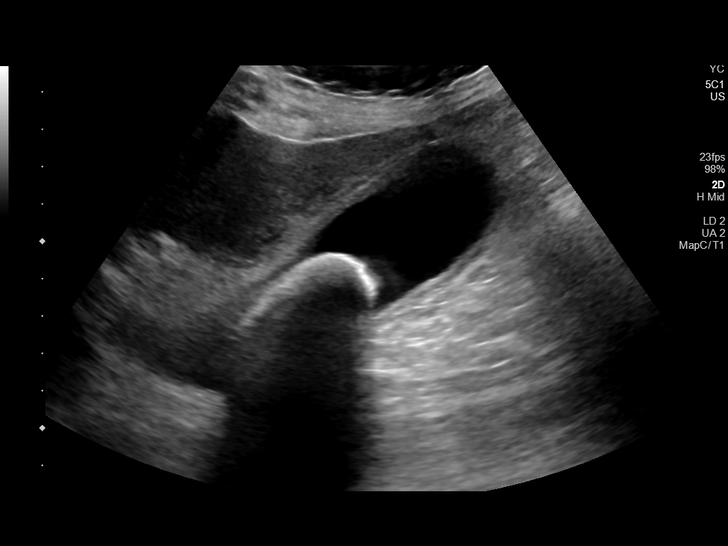
[im 5/57]
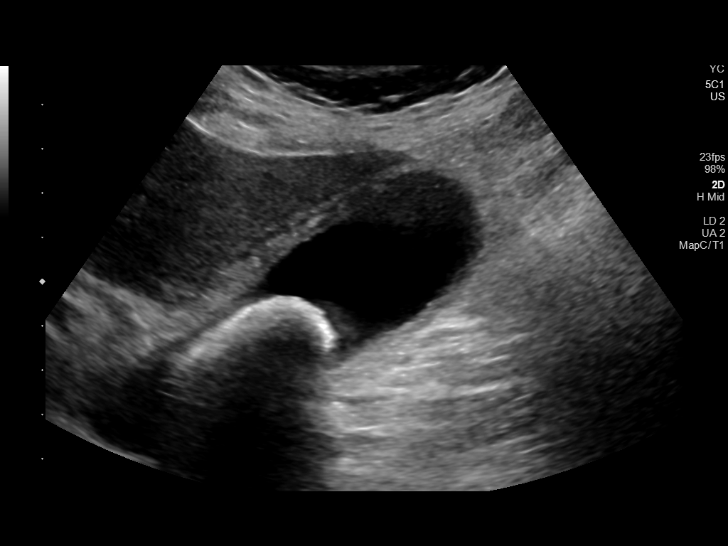
[im 10/57]
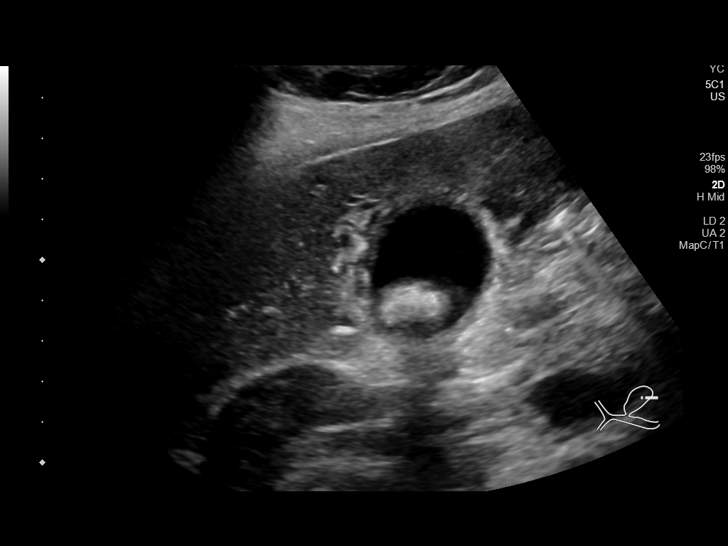
[im 15/57]
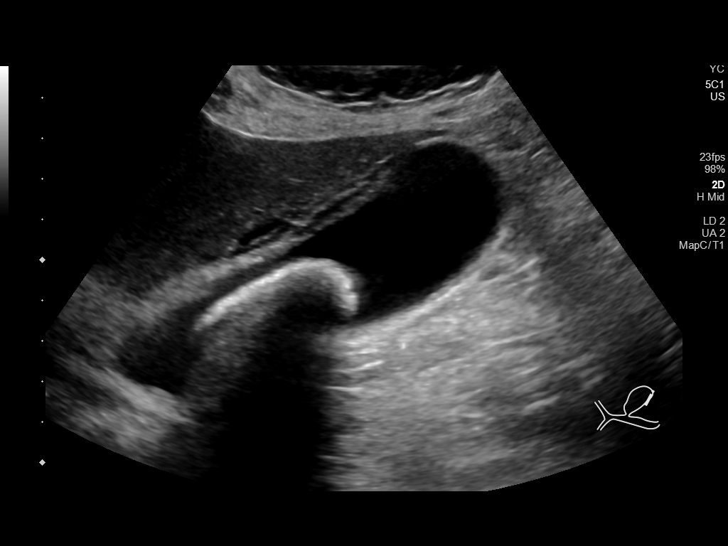
[im 19/57]
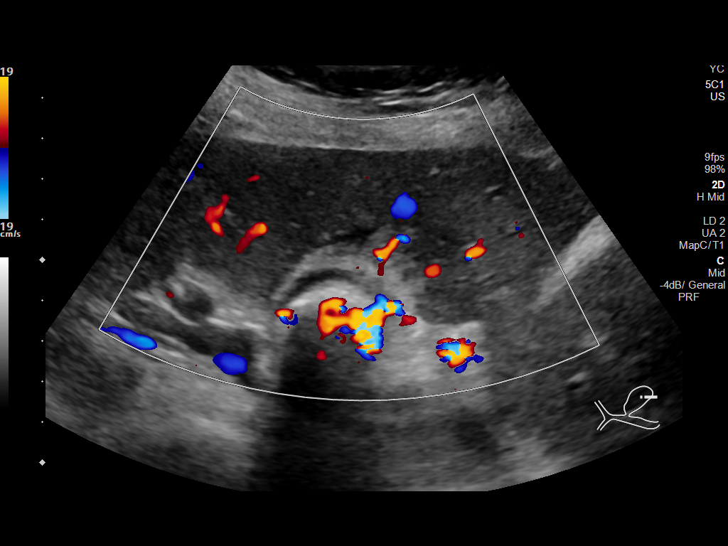
[im 22/57]
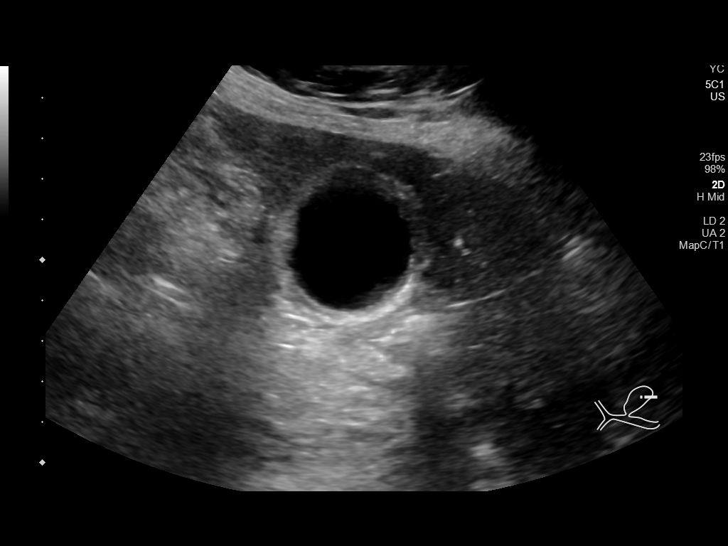
[im 26/57]
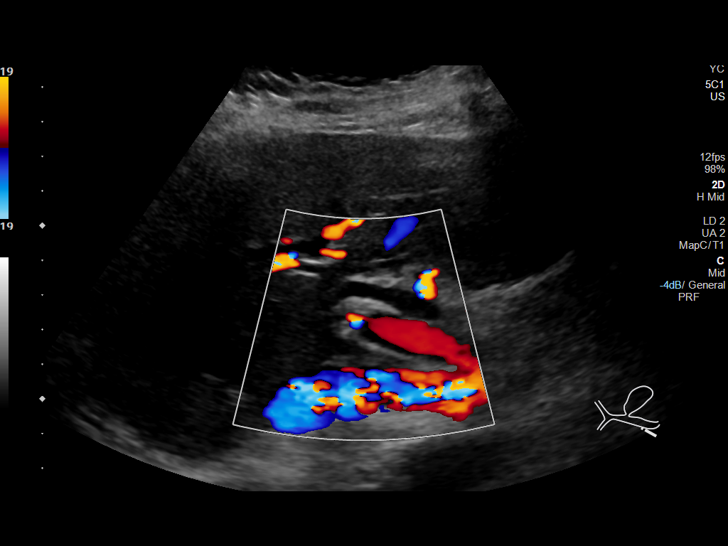
[im 31/57]
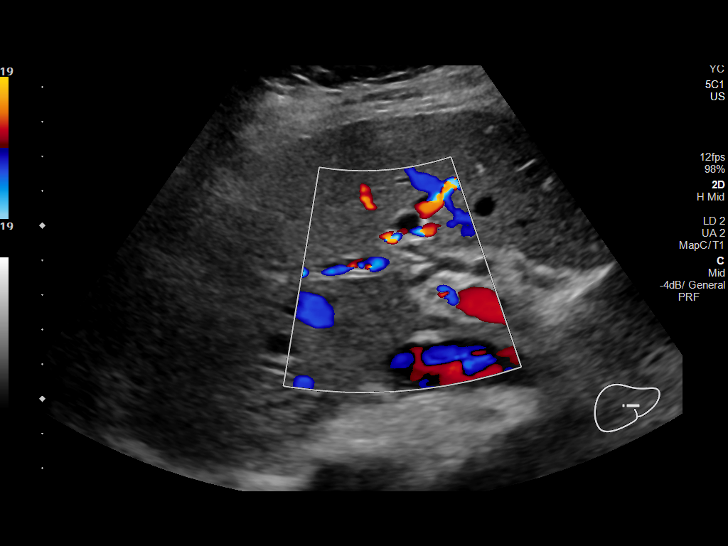
[im 36/57]
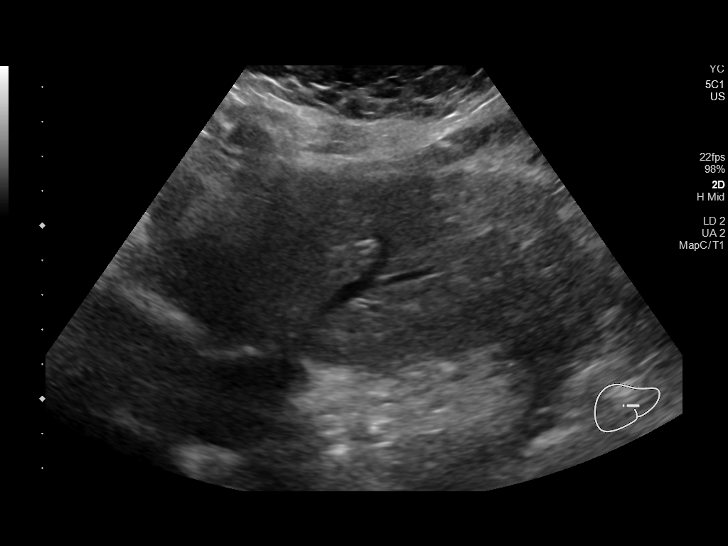
[im 38/57]
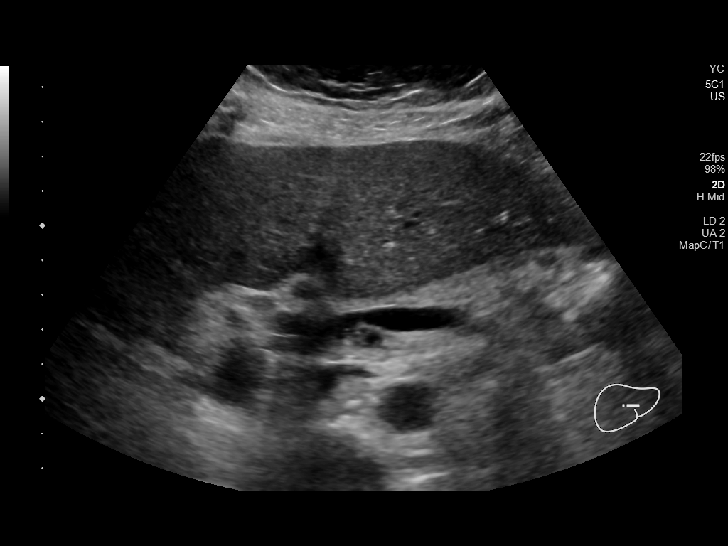
[im 43/57]
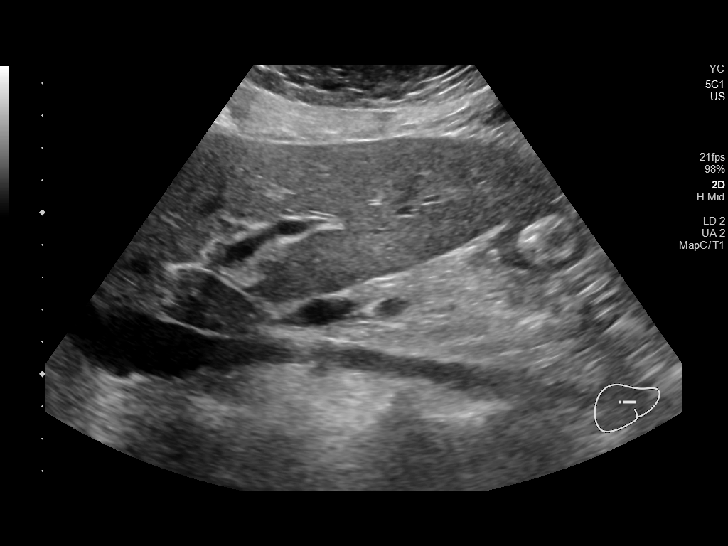
[im 47/57]
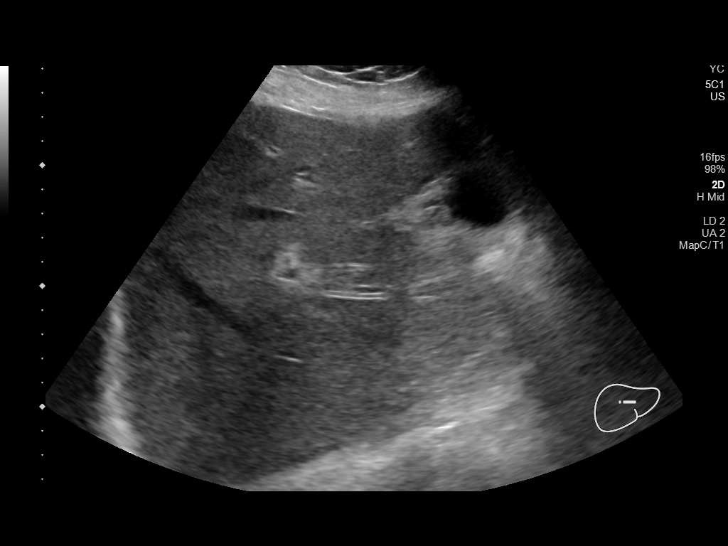
[im 52/57]
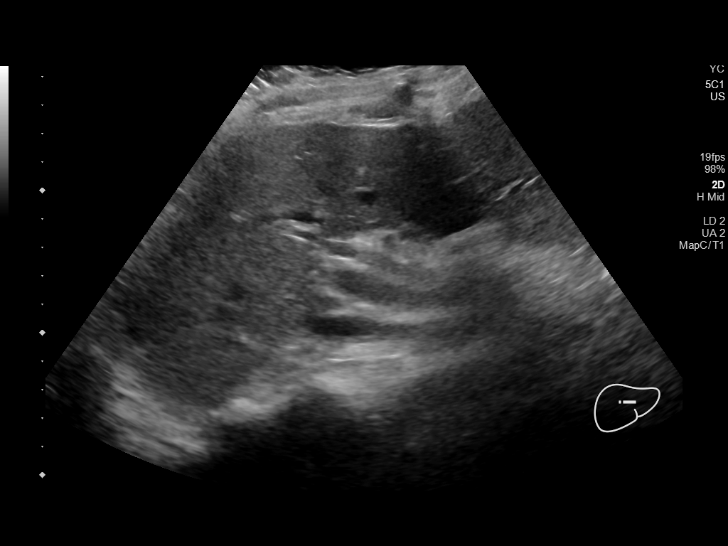
[im 57/57]
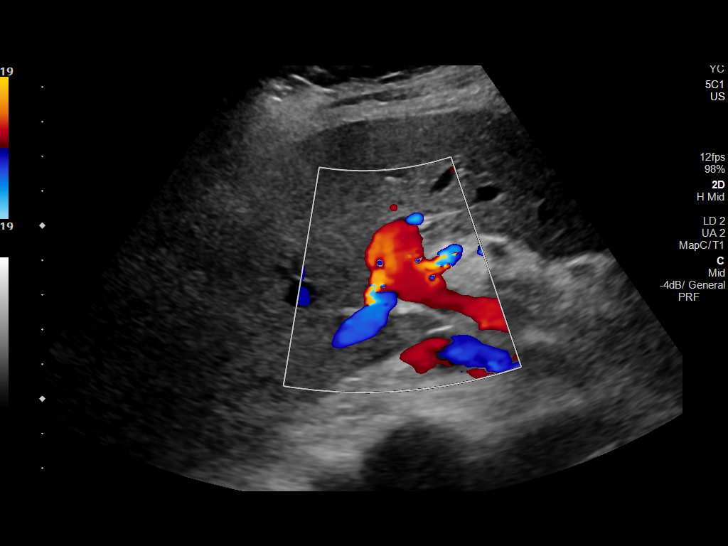

[14 of 25 positions shown; findings below may reference images not displayed]

FINDINGS: Gallbladder:

4 cm echogenic calculus noted in the gallbladder with associated
acoustic shadowing. There is also moderate gallbladder wall
thickening and a small sliver of pericholecystic fluid. The
gallbladder wall measures up to 9 mm. No sonographic Murphy sign.

Common bile duct:

Diameter: 6.5 mm.  No common bile duct stones are identified.

Liver:

Normal hepatic echogenicity without focal lesions. Mild intrahepatic
biliary dilatation. Portal vein is patent on color Doppler imaging
with normal direction of blood flow towards the liver.

Other: None.
IMPRESSION: 1. 4 cm gallstone in the gallbladder. There is gallbladder wall
thickening and edema and thus small amount of pericholecystic fluid
suggesting acute cholecystitis.
2. Borderline common bile duct dilatation and minimal intrahepatic
biliary dilatation.
3. No hepatic lesions.

## 2021-11-27 IMAGING — RF DG CHOLANGIOGRAM OPERATIVE
1 series · 4 of 4 positions shown · non-contrast
Comparison: Ultrasound dated 10/30/2019.

CLINICAL DATA: 59-year-old female with history of gallstone.

EXAM:
INTRAOPERATIVE CHOLANGIOGRAM
TECHNIQUE: Cholangiographic images from the C-arm fluoroscopic device were
submitted for interpretation post-operatively. Please see the
procedural report for the amount of contrast and the fluoroscopy
time utilized.

[Series 1: run · 4 of 65 frames shown]
[frame 8/65]
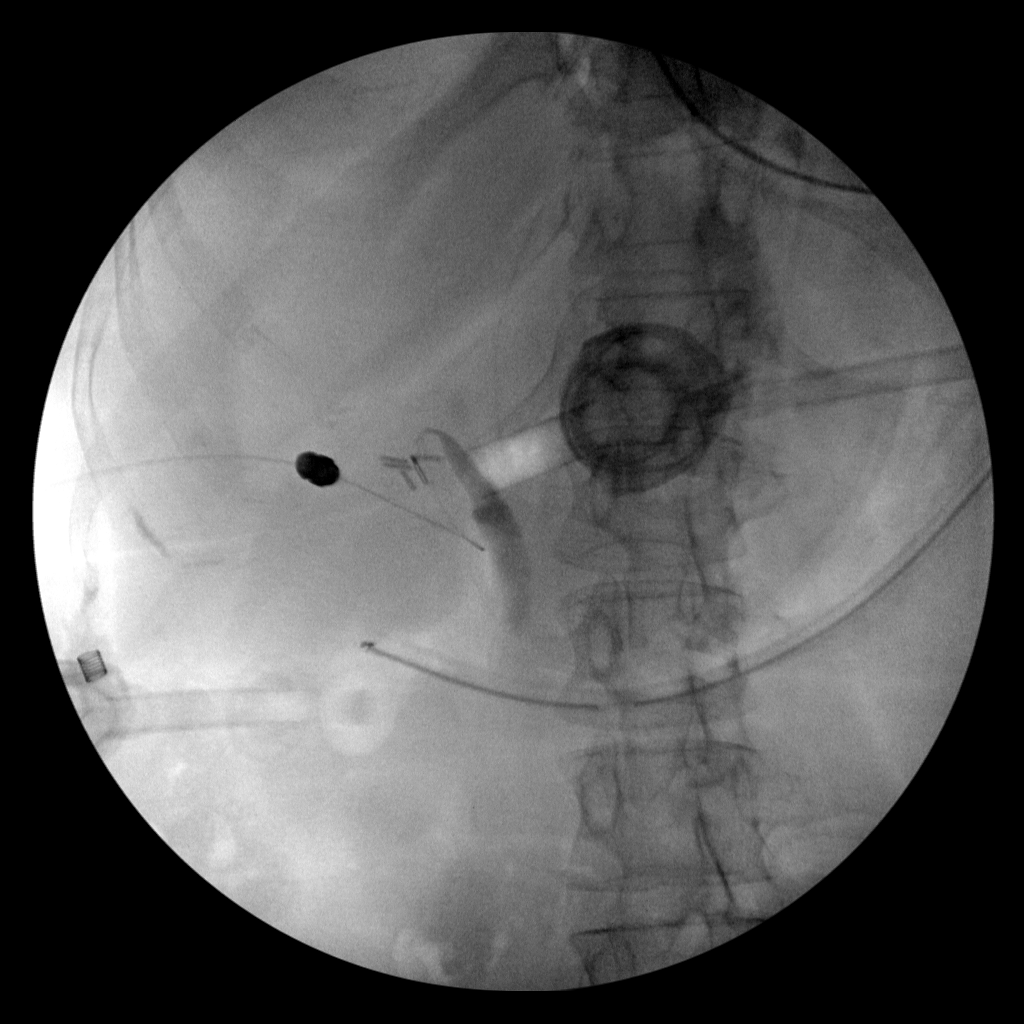
[frame 10/65]
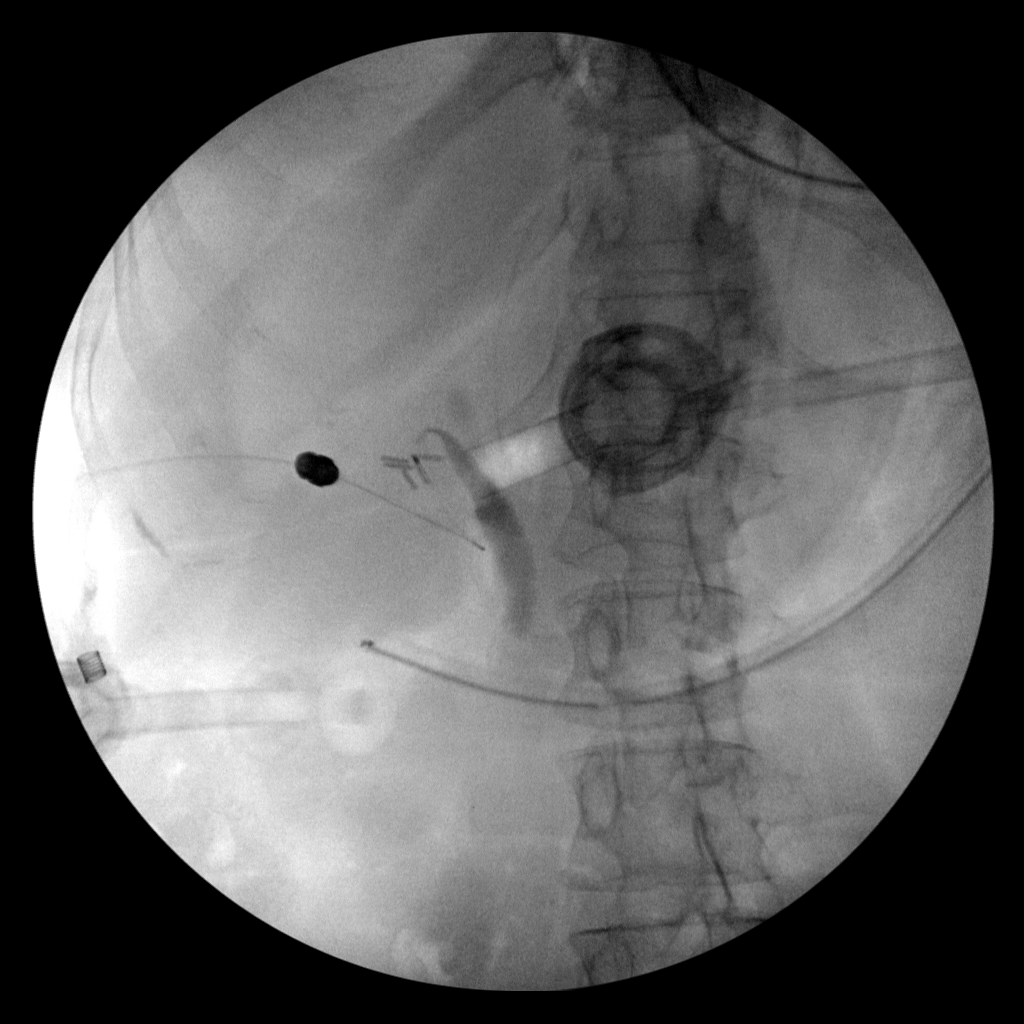
[frame 33/65]
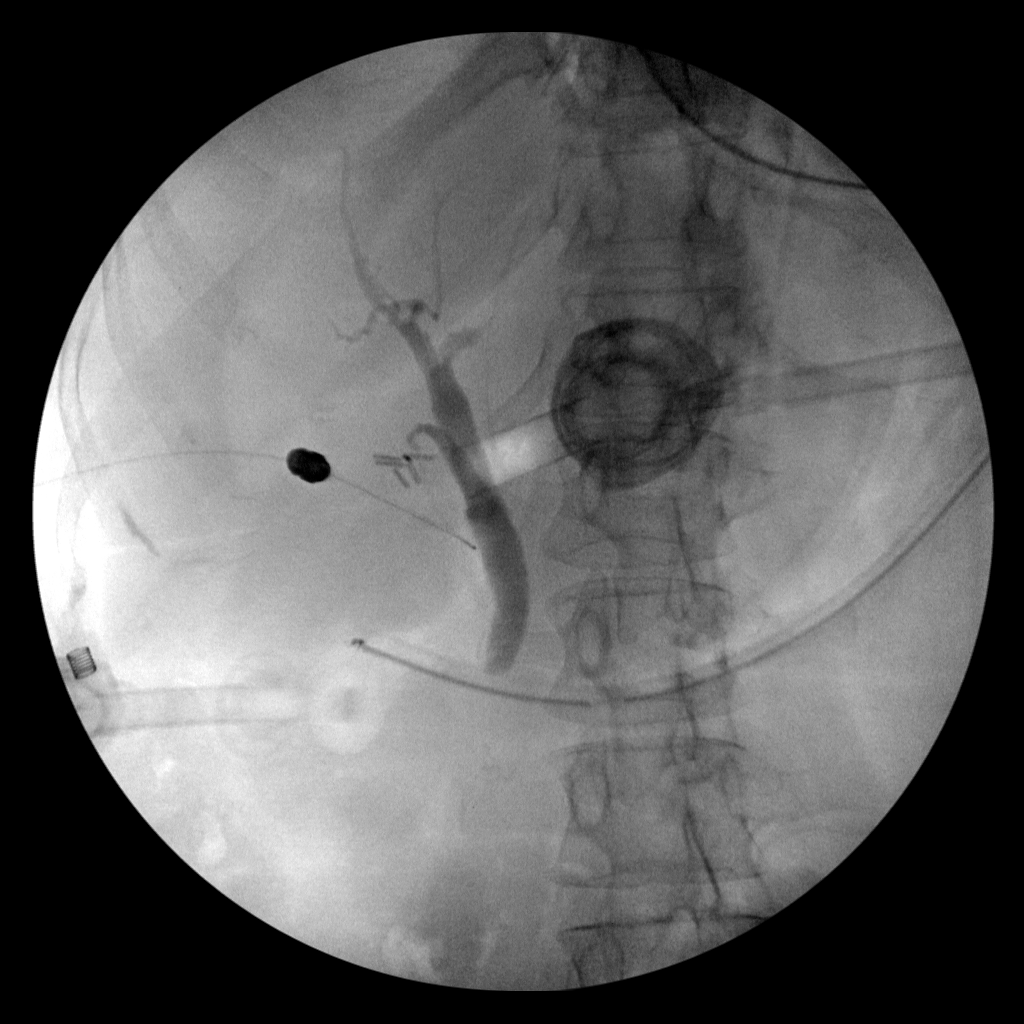
[frame 56/65]
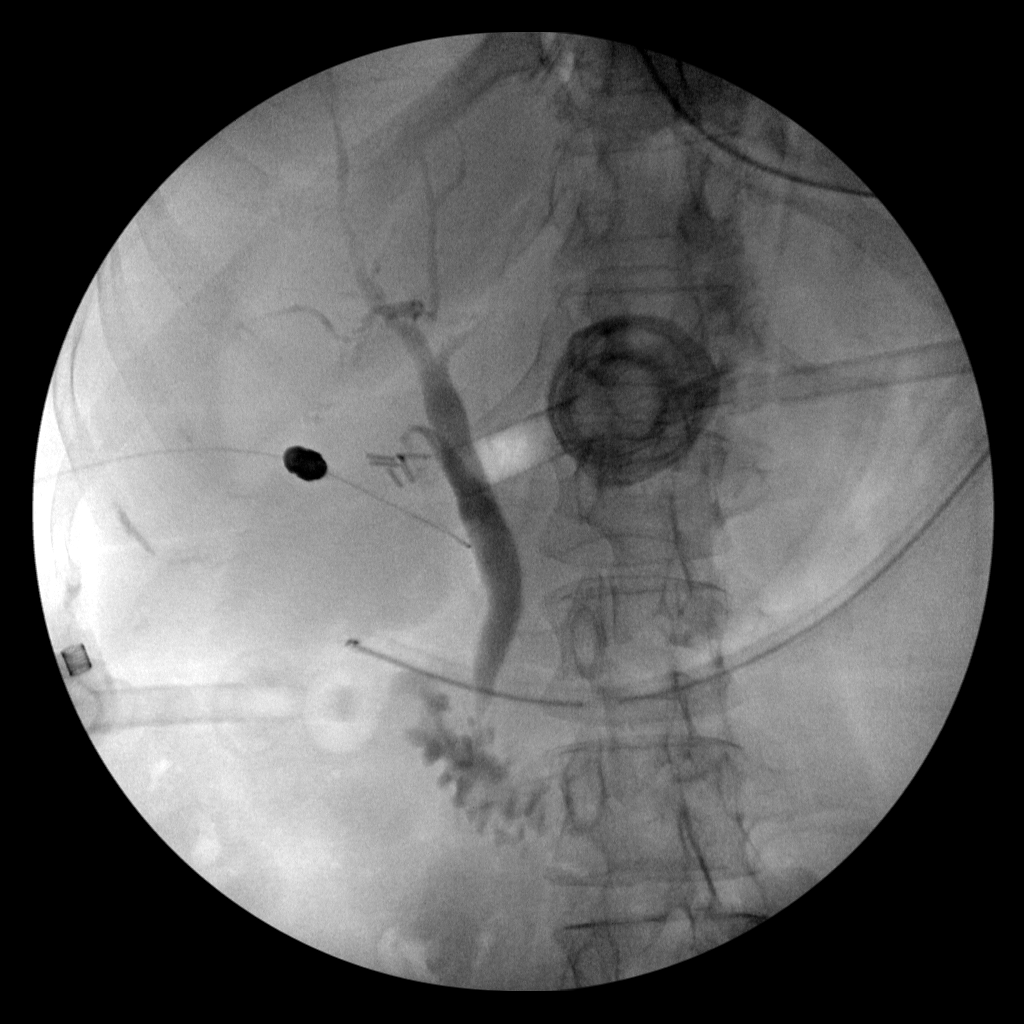

[4 of 4 positions shown; findings below may reference images not displayed]

FINDINGS: Fluoroscopic images of cholangiogram provided.

Right upper quadrant cholecystectomy clips noted. Injected contrast
opacifies the central biliary tree and common bile duct. No obvious
filling defect identified in the common bile duct to suggest biliary
stone. Contrast opacifies the proximal duodenum.
IMPRESSION: No obvious filling defect in the common bile duct to suggest biliary
stone.
